# Patient Record
Sex: Male | Born: 1980 | Race: White | Hispanic: No | Marital: Married | Smoking: Current every day smoker
Health system: Southern US, Community
[De-identification: ages and names within clinical notes are randomized; demographics above are authoritative.]

## PROBLEM LIST (undated history)

## (undated) DIAGNOSIS — K219 Gastro-esophageal reflux disease without esophagitis: Secondary | ICD-10-CM

## (undated) DIAGNOSIS — D3A026 Benign carcinoid tumor of the rectum: Secondary | ICD-10-CM

## (undated) DIAGNOSIS — I10 Essential (primary) hypertension: Secondary | ICD-10-CM

## (undated) DIAGNOSIS — E119 Type 2 diabetes mellitus without complications: Secondary | ICD-10-CM

## (undated) DIAGNOSIS — Z973 Presence of spectacles and contact lenses: Secondary | ICD-10-CM

## (undated) DIAGNOSIS — K760 Fatty (change of) liver, not elsewhere classified: Secondary | ICD-10-CM

## (undated) DIAGNOSIS — J45909 Unspecified asthma, uncomplicated: Secondary | ICD-10-CM

## (undated) HISTORY — DX: Essential (primary) hypertension: I10

## (undated) HISTORY — DX: Type 2 diabetes mellitus without complications: E11.9

## (undated) HISTORY — DX: Gastro-esophageal reflux disease without esophagitis: K21.9

## (undated) HISTORY — PX: COLONOSCOPY: SHX174

---

## 2008-08-10 ENCOUNTER — Emergency Department (HOSPITAL_COMMUNITY): Admission: EM | Admit: 2008-08-10 | Discharge: 2008-08-10 | Payer: Self-pay | Admitting: Emergency Medicine

## 2015-03-01 HISTORY — PX: ESOPHAGOGASTRODUODENOSCOPY: SHX1529

## 2018-05-27 ENCOUNTER — Encounter: Payer: Self-pay | Admitting: Gastroenterology

## 2018-08-26 ENCOUNTER — Encounter: Payer: Self-pay | Admitting: *Deleted

## 2018-08-26 ENCOUNTER — Encounter: Payer: Self-pay | Admitting: Gastroenterology

## 2018-08-26 ENCOUNTER — Ambulatory Visit (INDEPENDENT_AMBULATORY_CARE_PROVIDER_SITE_OTHER): Payer: Managed Care, Other (non HMO) | Admitting: Gastroenterology

## 2018-08-26 DIAGNOSIS — K760 Fatty (change of) liver, not elsewhere classified: Secondary | ICD-10-CM | POA: Insufficient documentation

## 2018-08-26 NOTE — Progress Notes (Signed)
Primary Care Physician:  Nathaniel Butcher, NP Primary Gastroenterologist:  Dr. Oneida Alar   Chief Complaint  Patient presents with  . fatty liver    HPI:   Nathaniel Wilson is a 37 y.o. male presenting today at the request of Nathaniel Butcher, NP, secondary to fatty liver. Outside imaging with ultrasound abdomen complete May 2019 noting hepatosplenomegaly (liver measures up to 20.1 cm, spleen 14.0cm). Hepatic steatosis. April 2019 labs with Alk Phos 82, AST 49, ALT 63, Tbili 0.4, BUN 12, Cr 0.89.   He tells me that hepatitis labs were drawn. He has seen Dr. Earley Brooke in the past. History of abdominal pain.   EGD 3 years ago, colonoscopy. Dr. Earley Brooke. ?Pre-cancerous polyp and stomach polyp. States he may have had carcinoid tumor but now thinks he didn't. Difficult historian.   Omeprazole 20 mg daily helps with GERD.   Noted right-sided discomfort, RUQ/RLQ, starting several months ago. Pain is constant sometimes. Feels light-headed sometimes. Occasional solid food dysphagia. No rectal bleeding. Sometimes worse after eating. Has intermittent headaches. Drinks a 12 pack of beer daily chronically.   Sister passed away from colon cancer age 37.   Past Medical History:  Diagnosis Date  . Diabetes ([place])   . GERD (gastroesophageal reflux disease)   . Hypertension     Past Surgical History:  Procedure Laterality Date  . COLONOSCOPY     Dr. Earley Brooke  . ESOPHAGOGASTRODUODENOSCOPY     Dr. Earley Brooke    Current Outpatient Medications  Medication Sig Dispense Refill  . fluticasone (FLONASE) 50 MCG/ACT nasal spray Place 1 spray into both nostrils daily.    . Fluticasone-Salmeterol (ADVAIR) 250-50 MCG/DOSE AEPB Inhale 1 puff into the lungs daily.    Marland Kitchen glipiZIDE (GLUCOTROL) 10 MG tablet Take 10 mg by mouth daily before breakfast.    . insulin detemir (LEVEMIR) 100 UNIT/ML injection Inject 12 Units into the skin daily.    Marland Kitchen lisinopril (PRINIVIL,ZESTRIL) 10 MG tablet Take 1 tablet by mouth daily.  2  .  omeprazole (PRILOSEC) 20 MG capsule Take 20 mg by mouth every other day.     No current facility-administered medications for this visit.     Allergies as of 08/26/2018  . (No Known Allergies)    Family History  Problem Relation Age of Onset  . Colon cancer Sister 27       deceased from colon cancer age 83   . Cancer Mother        late 43s, deceased   . Drug abuse Brother        drug overdose early 72s     Social History   Socioeconomic History  . Marital status: Single    Spouse name: Not on file  . Number of children: Not on file  . Years of education: Not on file  . Highest education level: Not on file  Occupational History  . Not on file  Social Needs  . Financial resource strain: Not on file  . Food insecurity:    Worry: Not on file    Inability: Not on file  . Transportation needs:    Medical: Not on file    Non-medical: Not on file  Tobacco Use  . Smoking status: Current Every Day Smoker    Packs/day: 1.00    Types: Cigarettes    Start date: 08/26/1993  . Smokeless tobacco: Never Used  Substance and Sexual Activity  . Alcohol use: Yes    Comment: 12 pack beer per day,  was drinking malt liquor before this daily.   . Drug use: Never  . Sexual activity: Not on file  Lifestyle  . Physical activity:    Days per week: Not on file    Minutes per session: Not on file  . Stress: Not on file  Relationships  . Social connections:    Talks on phone: Not on file    Gets together: Not on file    Attends religious service: Not on file    Active member of club or organization: Not on file    Attends meetings of clubs or organizations: Not on file    Relationship status: Not on file  . Intimate partner violence:    Fear of current or ex partner: Not on file    Emotionally abused: Not on file    Physically abused: Not on file    Forced sexual activity: Not on file  Other Topics Concern  . Not on file  Social History Narrative  . Not on file    Review of  Systems: Gen: Denies any fever, chills, fatigue, weight loss, lack of appetite.  CV: Denies chest pain, heart palpitations, peripheral edema, syncope.  Resp: Denies shortness of breath at rest or with exertion. Denies wheezing or cough.  GI: see HPI  GU : Denies urinary burning, urinary frequency, urinary hesitancy MS: Denies joint pain, muscle weakness, cramps, or limitation of movement.  Derm: Denies rash, itching, dry skin Psych: Denies depression, anxiety, memory loss, and confusion Heme: Denies bruising, bleeding, and enlarged lymph nodes.  Physical Exam: BP 132/74   Pulse 62   Temp (!) 97 F (36.1 C) (Oral)   Ht _0  (1.803 m)   Wt 223 lb 3.2 oz (101.2 kg)   BMI 31.13 kg/m  General:   Alert and oriented. Pleasant and cooperative. Well-nourished and well-developed.  Head:  Normocephalic and atraumatic. Eyes:  Without icterus, sclera clear and conjunctiva pink.  Ears:  Normal auditory acuity. Nose:  No deformity, discharge,  or lesions. Mouth:  No deformity or lesions, oral mucosa pink.  Lungs:  Clear to auscultation bilaterally. No wheezes, rales, or rhonchi. No distress.  Heart:  S1, S2 present without murmurs appreciated.  Abdomen:  +BS, soft, mild TTP RUQ and non-distended. Liver margin palpable below right ribcage.  No guarding or rebound. No masses appreciated.  Rectal:  Deferred  Msk:  Symmetrical without gross deformities. Normal posture. Extremities:  Without edema. Neurologic:  Alert and  oriented x4 Psych:  Alert and cooperative. Normal mood and affect.

## 2018-08-26 NOTE — Patient Instructions (Signed)
I am requesting records from Dr. Earley Brooke.  Please have blood work done.   We have ordered a special ultrasound of your liver.  It is very important to cut down on drinking with goal of eliminating this in the future. Let us know if you need help with referrals for counseling.  I will see you in 3 months regardless!  It was a pleasure to see you today. I strive to create trusting relationships with patients to provide genuine, compassionate, and quality care. I value your feedback. If you receive a survey regarding your visit,  I greatly appreciate you taking time to fill this out.   Annitta Needs, PhD, ANP-BC [facility]

## 2018-08-27 ENCOUNTER — Encounter: Payer: Self-pay | Admitting: Gastroenterology

## 2018-08-27 NOTE — Assessment & Plan Note (Signed)
37 year old male with known fatty liver, hepatosplenomegaly on ultrasound earlier this year, mildly elevated transaminases in the setting of chronic ETOH use. RUQ pain for several months in setting of hepatomegaly. Does not appear biliary related.   Discussed at length progression to likely cirrhosis if continued ETOH use. Discussed referral to treatment center but declining. Will check CBC, HFP, BMP, INR, iron studies, and elastography now. May need updated EGD if evidence of advanced fibrosis.  Obtain outside viral serologies reportedly already completed Continue omeprazole once daily Attempt to obtain outside EGD/TCS reports from Dr. Earley Brooke Healthy diet/exercise discussed Return in 3 months regardless

## 2018-08-28 NOTE — Progress Notes (Signed)
CC'D TO PCP °

## 2018-09-09 ENCOUNTER — Ambulatory Visit (HOSPITAL_COMMUNITY)
Admission: RE | Admit: 2018-09-09 | Discharge: 2018-09-09 | Disposition: A | Discharge status: Home / Self Care | Payer: Managed Care, Other (non HMO) | Source: Ambulatory Visit | Attending: Gastroenterology | Admitting: Gastroenterology

## 2018-09-09 DIAGNOSIS — K760 Fatty (change of) liver, not elsewhere classified: Secondary | ICD-10-CM | POA: Diagnosis present

## 2018-09-12 NOTE — Progress Notes (Signed)
Elastography with F2/F3. Alcohol cessation strongly advised. Awaiting labs. Has he had these done?

## 2018-09-15 NOTE — Progress Notes (Signed)
PT's wife, Nathaniel Wilson called for the results and is aware. He had labs done with PCP.  She will call PCP and have the lab results faxed here

## 2018-09-15 NOTE — Progress Notes (Signed)
LMOM to call.

## 2018-09-16 NOTE — Progress Notes (Signed)
I received labs. I still don't have hepatitis labs. 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 He also has Barrett's esophagus. He needs colonoscopy/EGD with Propofol by Dr. Oneida Alar if he is willing. I need to discuss with them risks and benefits when you get up with them.

## 2018-09-16 NOTE — Progress Notes (Signed)
Outside labs reviewed Sept 2019:  CBC without anemia, Platelets 181, BUN 15, Creatinine 0.85, Alk Phos 73, AST 38, ALT 50, iron sats 37, ferritin 289, INR 1.1.   I still don't have records of viral hepatitis labs.   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   EGD March 2016: esophageal erosions, erosions in duodenal bulb, +Barrett's without dysplasia.   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 He is also overdue for routine Barrett's surveillance.   All records were obtained after his visit and not present with him at time of visit. We will contact him and discuss pursuing colonoscopy/EGD with Dr. Oneida Alar with Propofol in near future.

## 2018-09-22 NOTE — Progress Notes (Signed)
LMOM to call.

## 2018-09-23 NOTE — Progress Notes (Signed)
LMOM to call.

## 2018-09-24 ENCOUNTER — Telehealth: Payer: Self-pay | Admitting: Gastroenterology

## 2018-09-24 NOTE — Telephone Encounter (Signed)
Pt called for DS. Please call [phone]

## 2018-09-24 NOTE — Progress Notes (Signed)
Letter mailed to call.  

## 2018-09-24 NOTE — Telephone Encounter (Signed)
Pt's wife, Julien Girt called and was told the info ( see US Abdomen results documentation).  She said to proceed to schedule the TCS and EGD, but Vicente Males wants to speak with him about the risks and benefits. She said she would have pt call this afternoon when he gets off work at 4:30 pm.

## 2018-09-29 ENCOUNTER — Telehealth: Payer: Self-pay

## 2018-09-29 ENCOUNTER — Other Ambulatory Visit: Payer: Self-pay

## 2018-09-29 DIAGNOSIS — K22719 Barrett's esophagus with dysplasia, unspecified: Secondary | ICD-10-CM

## 2018-09-29 DIAGNOSIS — Z85040 Personal history of malignant carcinoid tumor of rectum: Secondary | ICD-10-CM

## 2018-09-29 MED ORDER — PEG 3350-KCL-NA BICARB-NACL 420 G PO SOLR: 4000.0000 mL | ORAL | 0 refills | Status: DC

## 2018-09-29 NOTE — Progress Notes (Signed)
Forwarding to [facility] to schedule.  

## 2018-09-29 NOTE — Progress Notes (Signed)
No oral diabetes meds day of. 1/2 dose of Levemir day prior.

## 2018-09-29 NOTE — Progress Notes (Signed)
Late entry: I did speak with patient regarding colonoscopy/EGD with Propofol by Dr. Oneida Alar. He is aware of risks and benefits and agreeable to proceed.

## 2018-09-29 NOTE — Telephone Encounter (Signed)
See result note.  

## 2018-09-29 NOTE — Telephone Encounter (Signed)
Opened in error

## 2018-09-30 ENCOUNTER — Telehealth: Payer: Self-pay

## 2018-09-30 ENCOUNTER — Telehealth: Payer: Self-pay | Admitting: Gastroenterology

## 2018-09-30 NOTE — Telephone Encounter (Signed)
Pt called to let us know his doctor called him about her [place] results and was told to follow up for his fatty liver. He is aware of his OV in January.

## 2018-09-30 NOTE — Telephone Encounter (Signed)
Pt returned Martina's call and is aware that she mailed all of his information and instructions and the procedure is scheduled for Dec.   He knows to check with his pharmacy also in regards to his prescription.

## 2018-10-14 ENCOUNTER — Telehealth: Payer: Self-pay | Admitting: Gastroenterology

## 2018-10-14 NOTE — Telephone Encounter (Signed)
Lmom, waiting on a return call.  

## 2018-10-14 NOTE — Telephone Encounter (Signed)
[phone]  PLEASE CALL PATIENT, HE HAS QUESTION ABOUT FATTY LIVER

## 2018-10-14 NOTE — Telephone Encounter (Signed)
Pt called back and said the results given to him s/p his u/s are different from what his pcp or other doctor told him. He wants to discuss the results again with the nurse that he previously spoke with. Will route message to DS.

## 2018-10-15 NOTE — Telephone Encounter (Signed)
PT has questions about his [place]. Said he was told by PCP that he has fatty liver.  He wants to know if he has cirrhosis of the liver. I told him only see fatty filtration of the liver and risk of fibrosis is moderate. Vicente Males, please advise.

## 2018-10-15 NOTE — Telephone Encounter (Signed)
Yes, he has a fatty liver. No cirrhosis. However, it can progress to cirrhosis over time. He needs to completely avoid alcohol. 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 The range is 0-4; so, he is at risk of progressing to 4, which would be concerning for cirrhosis.   Instructions for fatty liver: Recommend 1-2# weight loss per week until ideal body weight through exercise & diet. Low fat/cholesterol diet.   Avoid sweets, sodas, fruit juices, sweetened beverages like tea, etc. Gradually increase exercise from 15 min daily up to 1 hr per day 5 days/week. Limit alcohol use.

## 2018-10-15 NOTE — Telephone Encounter (Signed)
LMOM for a return call.  

## 2018-10-16 NOTE — Telephone Encounter (Signed)
PT is aware and I have mailed him the instructions for fatty liver and the diets.

## 2018-10-16 NOTE — Telephone Encounter (Signed)
LMOM to call.

## 2018-10-20 ENCOUNTER — Other Ambulatory Visit: Payer: Self-pay

## 2018-10-20 ENCOUNTER — Telehealth: Payer: Self-pay

## 2018-10-20 DIAGNOSIS — R109 Unspecified abdominal pain: Secondary | ICD-10-CM

## 2018-10-20 DIAGNOSIS — K760 Fatty (change of) liver, not elsewhere classified: Secondary | ICD-10-CM

## 2018-10-20 NOTE — Telephone Encounter (Signed)
VM received from pt asking nurse to call back. Returned call. Pt is concerned with the sharp pain he has in his upper rt abdomen, mid abdomen and lower rt abdomen. Pt states his stool is grey x 2 weeks and scared it's his gall bladder. He has a bowel movement daily and reports it's been once daily since he stopped drinking. Pt says he d/c drinking last Thursday and use to have 3 bowel movements daily when he drunk. Pt would like to be seen before 12/2018. He is scheduled for a TCS in 11/2018 with SF.

## 2018-10-20 NOTE — Telephone Encounter (Signed)
Noted  

## 2018-10-20 NOTE — Telephone Encounter (Signed)
No gallstones on recent ultrasound. We need to check CMP now. It is not uncommon to have more frequent stools when drinking alcohol; now that he has stopped drinking, he is at a new baseline. Please arrange an appt to be seen in next 2-3 weeks.

## 2018-10-20 NOTE — Telephone Encounter (Signed)
Pt is aware. Lab orders placed for [facility] per pts request. Please schedule apt in 2-3 weeks with AB.

## 2018-10-20 NOTE — Addendum Note (Signed)
Addended by: Annitta Needs on: 10/20/2018 04:27 PM   Modules accepted: Orders

## 2018-10-20 NOTE — Telephone Encounter (Signed)
Alicia: I inadvertently signed lab order before I realized what it was. Needed to be complete metabolic panel. I have put back in under [facility].

## 2018-10-21 ENCOUNTER — Encounter: Payer: Self-pay | Admitting: Gastroenterology

## 2018-10-21 NOTE — Telephone Encounter (Signed)
PATIENT SCHEDULED FOR Friday 10/31/18.  LEFT MESSAGE ON PATIENT CELL AND ALSO MAILED LETTER

## 2018-10-31 ENCOUNTER — Ambulatory Visit: Payer: BLUE CROSS/BLUE SHIELD | Admitting: Gastroenterology

## 2018-10-31 ENCOUNTER — Encounter: Payer: Self-pay | Admitting: Gastroenterology

## 2018-10-31 VITALS — BP 113/74 | HR 66 | Temp 97.2°F | Ht 71.0 in | Wt 219.6 lb

## 2018-10-31 DIAGNOSIS — K2270 Barrett's esophagus without dysplasia: Secondary | ICD-10-CM | POA: Diagnosis not present

## 2018-10-31 DIAGNOSIS — R1011 Right upper quadrant pain: Secondary | ICD-10-CM | POA: Diagnosis not present

## 2018-10-31 DIAGNOSIS — D3A026 Benign carcinoid tumor of the rectum: Secondary | ICD-10-CM | POA: Diagnosis not present

## 2018-10-31 NOTE — Progress Notes (Addendum)
REVIEWED-NO ADDITIONAL RECOMMENDATIONS.  Referring Provider: Allayne Butcher, NP Primary Care Physician:  Allayne Butcher, NP Primary GI: Dr. Oneida Alar   Chief Complaint  Patient presents with  . Abdominal Pain    upper/lower abd, right side. Upper right pain is sharp/stabby at times  . Diarrhea    QOD    HPI:   Nathaniel Wilson is a 37 y.o. 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 History of Barrett's with last EGD in March 2016. He is already scheduled for colonoscopy/EGD with Dr. Oneida Alar in December with Propofol. He was brought in due to reports of RUQ abdominal pain that has persisted since visit in Aug 2019 and reports of gray stool.  History of fatty liver as well, ETOH abuse, with elastography F2/F3.   When quit drinking for 4 days, started to see gray stool for a few days. Has epigastric pain, RUQ, RLQ. Chronic. Notes postprandial pain at times. When walking, pain is worse. No N/V. Prilosec 20 mg daily, which helps indigestion. No dysphagia. 3 watery stools with chunks every day. Working on trying to quit. Went back to drinking and trying to decrease the amount of alcohol per day.    Outside labs reviewed Sept 2019:  CBC without anemia, Platelets 181, BUN 15, Creatinine 0.85, Alk Phos 73, AST 38, ALT 50, iron sats 37, ferritin 289, INR 1.1.   Nov 2018: negative Hep A antibody, negative Hep B surface antigen, negative Hep B core antibody, negative Hep C antibody.    Past Medical History:  Diagnosis Date  . Diabetes ([place])   . GERD (gastroesophageal reflux disease)   . Hypertension     Past Surgical History:  Procedure Laterality Date  . COLONOSCOPY     Dr. 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 ESOPHAGOGASTRODUODENOSCOPY  03/2015   Dr. Earley Brooke: esophageal erosions, erosions in duodenal bulb, +Barrett's but no dysplasia     Current Outpatient Medications  Medication Sig Dispense Refill  . fluticasone (FLONASE) 50 MCG/ACT nasal spray Place 1 spray into both nostrils daily.    . Fluticasone-Salmeterol (ADVAIR) 250-50 MCG/DOSE AEPB Inhale 1 puff into the lungs daily.    Marland Kitchen glipiZIDE (GLUCOTROL) 10 MG tablet Take 10 mg by mouth daily before breakfast.    . insulin detemir (LEVEMIR) 100 UNIT/ML injection Inject 12 Units into the skin daily.    Marland Kitchen lisinopril (PRINIVIL,ZESTRIL) 10 MG tablet Take 1 tablet by mouth daily.  2  . omeprazole (PRILOSEC) 20 MG capsule Take 20 mg by mouth every other day.    . polyethylene glycol-electrolytes (TRILYTE) 420 g solution Take 4,000 mLs by mouth as directed. 4000 mL 0   No current facility-administered medications for this visit.     Allergies as of 10/31/2018  . (No Known Allergies)    Family History  Problem Relation Age of Onset  . Colon cancer Sister 102       deceased from colon cancer age 21   . Cancer Mother        late 11s, deceased   . Drug abuse Brother        drug overdose early 80s     Social History   Socioeconomic History  . Marital status: Married    Spouse name: Not on file  . Number of children: Not on file  . Years of education: Not on file  . Highest education level:  Not on file  Occupational History  . Not on file  Social Needs  . Financial resource strain: Not on file  . Food insecurity:    Worry: Not on file    Inability: Not on file  . Transportation needs:    Medical: Not on file    Non-medical: Not on file  Tobacco Use  . Smoking status: Current Every Day Smoker    Packs/day: 1.00    Types: Cigarettes    Start date: 08/26/1993  . Smokeless tobacco: Never Used  Substance and Sexual Activity  . Alcohol use: Yes    Comment: 12 pack beer per day,  was drinking malt liquor before this daily.   . Drug use: Never  . Sexual activity: Not on file  Lifestyle  . Physical activity:    Days per week: Not on file    Minutes per  session: Not on file  . Stress: Not on file  Relationships  . Social connections:    Talks on phone: Not on file    Gets together: Not on file    Attends religious service: Not on file    Active member of club or organization: Not on file    Attends meetings of clubs or organizations: Not on file    Relationship status: Not on file  Other Topics Concern  . Not on file  Social History Narrative  . Not on file    Review of Systems: Gen: Denies fever, chills, anorexia. Denies fatigue, weakness, weight loss.  CV: Denies chest pain, palpitations, syncope, peripheral edema, and claudication. Resp: Denies dyspnea at rest, cough, wheezing, coughing up blood, and pleurisy. GI: see HPI. Derm: Denies rash, itching, dry skin Psych: Denies depression, anxiety, memory loss, confusion. No homicidal or suicidal ideation.  Heme: Denies bruising, bleeding, and enlarged lymph nodes.  Physical Exam: BP 113/74   Pulse 66   Temp (!) 97.2 F (36.2 C) (Oral)   Ht 5' 11"  (1.803 m)   Wt 219 lb 9.6 oz (99.6 kg)   BMI 30.63 kg/m  General:   Alert and oriented. No distress noted. Pleasant and cooperative.  Head:  Normocephalic and atraumatic. Eyes:  Conjuctiva clear without scleral icterus. Mouth:  Oral mucosa pink and moist.  Lungs: clear to auscultation bilaterally  Cardiac: S1 S2 present without murmurs  Abdomen:  +BS, soft, non-tender and non-distended. No rebound or guarding. No HSM or masses noted. Msk:  Symmetrical without gross deformities. Normal posture. Extremities:  Without edema. Neurologic:  Alert and  oriented x4 Psych:  Alert and cooperative. Normal mood and affect.

## 2018-10-31 NOTE — Assessment & Plan Note (Signed)
History of 1.5 cm neuroendocrine tumor on path in 2016. No colonoscopy surveillance. Family history notable for colon cancer in sister at age 39, who is now deceased.  Proceed with colonoscopy with Dr. Oneida Alar in the near future. The risks, benefits, and alternatives have been discussed in detail with the patient. They state understanding and desire to proceed.  Propofol due to polypharmacy

## 2018-10-31 NOTE — Patient Instructions (Signed)
No PA needed for HIDA per AIM website.

## 2018-10-31 NOTE — Assessment & Plan Note (Signed)
Last EGD in 2016 by Dr. Earley Brooke. Surveillance due now. Continue Prilosec once daily, as this is symptomatically controlling GERD.   Proceed with upper endoscopy in the near future with Dr. Oneida Alar. The risks, benefits, and alternatives have been discussed in detail with patient. They have stated understanding and desire to proceed.  Propofol due to polypharmacy

## 2018-10-31 NOTE — Patient Instructions (Addendum)
I have ordered a special scan of your gallbladder.  If you have any clay-colored, gray stools, call us and we will do lab work.  Continue to work on decreasing alcohol intake, with the goal of no alcohol in the future.  Keep appointment for colonoscopy and upper endoscopy upcoming with Dr. Oneida Alar!  You will take 1/2 dose of insulin the day prior to colonoscopy, no insulin the day of the procedure. No glipizide the day of the procedure.  I enjoyed seeing you again today! As you know, I value our relationship and want to provide genuine, compassionate, and quality care. I welcome your feedback. If you receive a survey regarding your visit,  I greatly appreciate you taking time to fill this out. See you next time!  Annitta Needs, PhD, ANP-BC [facility]

## 2018-10-31 NOTE — Assessment & Plan Note (Signed)
Chronic RUQ, RLQ pain, postprandial component. Last EGD in 2016 with Barrett's and due for surveillance now. Gallbladder remains in situ. No gallstones. No concerning signs on exam. RLQ appears to be worsened with walking. May need CT in future if persistent. Notes gray stool when he abstained from drinking, now resolved. If further stool color changes, repeat HFP. Currently with unremarkable LFTs except mildly elevated ALT at 50 in Sept 2019. Known fatty liver.  Pursue HIDA Monitor for any stool color changes: obtain HFP if any further evidence Keep EGD appt upcoming Continue Prilosec Discussed importance of ETOH cessation

## 2018-11-03 NOTE — Progress Notes (Signed)
cc'ed to pcp °

## 2018-11-06 ENCOUNTER — Other Ambulatory Visit (HOSPITAL_COMMUNITY): Payer: BLUE CROSS/BLUE SHIELD

## 2018-11-19 ENCOUNTER — Ambulatory Visit (HOSPITAL_COMMUNITY): Payer: BLUE CROSS/BLUE SHIELD

## 2018-11-21 NOTE — Patient Instructions (Signed)
Nathaniel Wilson  11/21/2018     @PREFPERIOPPHARMACY @   Your procedure is scheduled on 12/09/2018.  Report to [facility] at  815   A.M.  Call this number if you have problems the morning of surgery:  [phone]   Remember:  Follow the diet and prep instructions given to you by Dr Nona Dell office.                       Take these medicines the morning of surgery with A SIP OF WATER  Lisinopril, omeprazole. Use your inhaler before you come. Take 1/2 of your usual night time insulin dosage the night before your procedure. DO NOT take any medications for diabetes the morning of your procedure.    Do not wear jewelry, make-up or nail polish.  Do not wear lotions, powders, or perfumes, or deodorant.  Do not shave 48 hours prior to surgery.  Men may shave face and neck.  Do not bring valuables to the hospital.  [facility] is not responsible for any belongings or valuables.  Contacts, dentures or bridgework may not be worn into surgery.  Leave your suitcase in the car.  After surgery it may be brought to your room.  For patients admitted to the hospital, discharge time will be determined by your treatment team.  Patients discharged the day of surgery will not be allowed to drive home.   Name and phone number of your driver:   Family   Special instructions:   None  Please read over the following fact sheets that you were given. Anesthesia Post-op Instructions and Care and Recovery After Surgery       Esophagogastroduodenoscopy Esophagogastroduodenoscopy (EGD) is a procedure to examine the lining of the esophagus, stomach, and first part of the small intestine (duodenum). This procedure is done to check for problems such as inflammation, bleeding, ulcers, or growths. During this procedure, a long, flexible, lighted tube with a camera attached (endoscope) is inserted down the throat. Tell a health care provider about:  Any allergies you have.  All  medicines you are taking, including vitamins, herbs, eye drops, creams, and over-the-counter medicines.  Any problems you or family members have had with anesthetic medicines.  Any blood disorders you have.  Any surgeries you have had.  Any medical conditions you have.  Whether you are pregnant or may be pregnant. What are the risks? Generally, this is a safe procedure. However, problems may occur, including:  Infection.  Bleeding.  A tear (perforation) in the esophagus, stomach, or duodenum.  Trouble breathing.  Excessive sweating.  Spasms of the larynx.  A slowed heartbeat.  Low blood pressure.  What happens before the procedure?  Follow instructions from your health care provider about eating or drinking restrictions.  Ask your health care provider about: ? Changing or stopping your regular medicines. This is especially important if you are taking diabetes medicines or blood thinners. ? Taking medicines such as aspirin and ibuprofen. These medicines can thin your blood. Do not take these medicines before your procedure if your health care provider instructs you not to.  Plan to have someone take you home after the procedure.  If you wear dentures, be ready to remove them before the procedure. What happens during the procedure?  To reduce your risk of infection, your health care team will wash or sanitize their hands.  An IV tube will  be put in a vein in your hand or arm. You will get medicines and fluids through this tube.  You will be given one or more of the following: ? A medicine to help you relax (sedative). ? A medicine to numb the area (local anesthetic). This medicine may be sprayed into your throat. It will make you feel more comfortable and keep you from gagging or coughing during the procedure. ? A medicine for pain.  A mouth guard may be placed in your mouth to protect your teeth and to keep you from biting on the endoscope.  You will be asked to  lie on your left side.  The endoscope will be lowered down your throat into your esophagus, stomach, and duodenum.  Air will be put into the endoscope. This will help your health care provider see better.  The lining of your esophagus, stomach, and duodenum will be examined.  Your health care provider may: ? Take a tissue sample so it can be looked at in a lab (biopsy). ? Remove growths. ? Remove objects (foreign bodies) that are stuck. ? Treat any bleeding with medicines or other devices that stop tissue from bleeding. ? Widen (dilate) or stretch narrowed areas of your esophagus and stomach.  The endoscope will be taken out. The procedure may vary among health care providers and hospitals. What happens after the procedure?  Your blood pressure, heart rate, breathing rate, and blood oxygen level will be monitored often until the medicines you were given have worn off.  Do not eat or drink anything until the numbing medicine has worn off and your gag reflex has returned. This information is not intended to replace advice given to you by your health care provider. Make sure you discuss any questions you have with your health care provider. Document Released: 04/19/2005 Document Revised: 05/24/2016 Document Reviewed: 11/10/2015 Elsevier Interactive Patient Education  2018 [employer]. Esophagogastroduodenoscopy, Care After Refer to this sheet in the next few weeks. These instructions provide you with information about caring for yourself after your procedure. Your health care provider may also give you more specific instructions. Your treatment has been planned according to current medical practices, but problems sometimes occur. Call your health care provider if you have any problems or questions after your procedure. What can I expect after the procedure? After the procedure, it is common to have:  A sore throat.  Nausea.  Bloating.  Dizziness.  Fatigue.  Follow these  instructions at home:  Do not eat or drink anything until the numbing medicine (local anesthetic) has worn off and your gag reflex has returned. You will know that the local anesthetic has worn off when you can swallow comfortably.  Do not drive for 24 hours if you received a medicine to help you relax (sedative).  If your health care provider took a tissue sample for testing during the procedure, make sure to get your test results. This is your responsibility. Ask your health care provider or the department performing the test when your results will be ready.  Keep all follow-up visits as told by your health care provider. This is important. Contact a health care provider if:  You cannot stop coughing.  You are not urinating.  You are urinating less than usual. Get help right away if:  You have trouble swallowing.  You cannot eat or drink.  You have throat or chest pain that gets worse.  You are dizzy or light-headed.  You faint.  You have nausea  or vomiting.  You have chills.  You have a fever.  You have severe abdominal pain.  You have black, tarry, or bloody stools. This information is not intended to replace advice given to you by your health care provider. Make sure you discuss any questions you have with your health care provider. Document Released: 12/03/2012 Document Revised: 05/24/2016 Document Reviewed: 11/10/2015 Elsevier Interactive Patient Education  2018 [employer].  Colonoscopy, Adult A colonoscopy is an exam to look at the large intestine. It is done to check for problems, such as:  Lumps (tumors).  Growths (polyps).  Swelling (inflammation).  Bleeding.  What happens before the procedure? Eating and drinking Follow instructions from your doctor about eating and drinking. These instructions may include:  A few days before the procedure - follow a low-fiber diet. ? Avoid nuts. ? Avoid seeds. ? Avoid dried fruit. ? Avoid raw  fruits. ? Avoid vegetables.  1-3 days before the procedure - follow a clear liquid diet. Avoid liquids that have red or purple dye. Drink only clear liquids, such as: ? Clear broth or bouillon. ? Black coffee or tea. ? Clear juice. ? Clear soft drinks or sports drinks. ? Gelatin dessert. ? Popsicles.  On the day of the procedure - do not eat or drink anything during the 2 hours before the procedure.  Bowel prep If you were prescribed an oral bowel prep:  Take it as told by your doctor. Starting the day before your procedure, you will need to drink a lot of liquid. The liquid will cause you to poop (have bowel movements) until your poop is almost clear or light green.  If your skin or butt gets irritated from diarrhea, you may: ? Wipe the area with wipes that have medicine in them, such as adult wet wipes with aloe and vitamin E. ? Put something on your skin that soothes the area, such as petroleum jelly.  If you throw up (vomit) while drinking the bowel prep, take a break for up to 60 minutes. Then begin the bowel prep again. If you keep throwing up and you cannot take the bowel prep without throwing up, call your doctor.  General instructions  Ask your doctor about changing or stopping your normal medicines. This is important if you take diabetes medicines or blood thinners.  Plan to have someone take you home from the hospital or clinic. What happens during the procedure?  An IV tube may be put into one of your veins.  You will be given medicine to help you relax (sedative).  To reduce your risk of infection: ? Your doctors will wash their hands. ? Your anal area will be washed with soap.  You will be asked to lie on your side with your knees bent.  Your doctor will get a long, thin, flexible tube ready. The tube will have a camera and a light on the end.  The tube will be put into your anus.  The tube will be gently put into your large intestine.  Air will be  delivered into your large intestine to keep it open. You may feel some pressure or cramping.  The camera will be used to take photos.  A small tissue sample may be removed from your body to be looked at under a microscope (biopsy). If any possible problems are found, the tissue will be sent to a lab for testing.  If small growths are found, your doctor may remove them and have them checked for cancer.  The tube that was put into your anus will be slowly removed. The procedure may vary among doctors and hospitals. What happens after the procedure?  Your doctor will check on you often until the medicines you were given have worn off.  Do not drive for 24 hours after the procedure.  You may have a small amount of blood in your poop.  You may pass gas.  You may have mild cramps or bloating in your belly (abdomen).  It is up to you to get the results of your procedure. Ask your doctor, or the department performing the procedure, when your results will be ready. This information is not intended to replace advice given to you by your health care provider. Make sure you discuss any questions you have with your health care provider. Document Released: 01/19/2011 Document Revised: 10/17/2016 Document Reviewed: 02/28/2016 Elsevier Interactive Patient Education  2017 [employer]  Colonoscopy, Adult, Care After This sheet gives you information about how to care for yourself after your procedure. Your health care provider may also give you more specific instructions. If you have problems or questions, contact your health care provider. What can I expect after the procedure? After the procedure, it is common to have:  A small amount of blood in your stool for 24 hours after the procedure.  Some gas.  Mild abdominal cramping or bloating.  Follow these instructions at home: General instructions   For the first 24 hours after the procedure: ? Do not drive or use machinery. ? Do not sign  important documents. ? Do not drink alcohol. ? Do your regular daily activities at a slower pace than normal. ? Eat soft, easy-to-digest foods. ? Rest often.  Take over-the-counter or prescription medicines only as told by your health care provider.  It is up to you to get the results of your procedure. Ask your health care provider, or the department performing the procedure, when your results will be ready. Relieving cramping and bloating  Try walking around when you have cramps or feel bloated.  Apply heat to your abdomen as told by your health care provider. Use a heat source that your health care provider recommends, such as a moist heat pack or a heating pad. ? Place a towel between your skin and the heat source. ? Leave the heat on for 20-30 minutes. ? Remove the heat if your skin turns bright red. This is especially important if you are unable to feel pain, heat, or cold. You may have a greater risk of getting burned. Eating and drinking  Drink enough fluid to keep your urine clear or pale yellow.  Resume your normal diet as instructed by your health care provider. Avoid heavy or fried foods that are hard to digest.  Avoid drinking alcohol for as long as instructed by your health care provider. Contact a health care provider if:  You have blood in your stool 2-3 days after the procedure. Get help right away if:  You have more than a small spotting of blood in your stool.  You pass large blood clots in your stool.  Your abdomen is swollen.  You have nausea or vomiting.  You have a fever.  You have increasing abdominal pain that is not relieved with medicine. This information is not intended to replace advice given to you by your health care provider. Make sure you discuss any questions you have with your health care provider. Document Released: 07/31/2004 Document Revised: 09/10/2016 Document Reviewed: 02/28/2016 Elsevier Interactive  Patient Education  2018 [place] Anesthesia is a term that refers to techniques, procedures, and medicines that help a person stay safe and comfortable during a medical procedure. Monitored anesthesia care, or sedation, is one type of anesthesia. Your anesthesia specialist may recommend sedation if you will be having a procedure that does not require you to be unconscious, such as:  Cataract surgery.  A dental procedure.  A biopsy.  A colonoscopy.  During the procedure, you may receive a medicine to help you relax (sedative). There are three levels of sedation:  Mild sedation. At this level, you may feel awake and relaxed. You will be able to follow directions.  Moderate sedation. At this level, you will be sleepy. You may not remember the procedure.  Deep sedation. At this level, you will be asleep. You will not remember the procedure.  The more medicine you are given, the deeper your level of sedation will be. Depending on how you respond to the procedure, the anesthesia specialist may change your level of sedation or the type of anesthesia to fit your needs. An anesthesia specialist will monitor you closely during the procedure. Let your health care provider know about:  Any allergies you have.  All medicines you are taking, including vitamins, herbs, eye drops, creams, and over-the-counter medicines.  Any use of steroids (by mouth or as a cream).  Any problems you or family members have had with sedatives and anesthetic medicines.  Any blood disorders you have.  Any surgeries you have had.  Any medical conditions you have, such as sleep apnea.  Whether you are pregnant or may be pregnant.  Any use of cigarettes, alcohol, or street drugs. What are the risks? Generally, this is a safe procedure. However, problems may occur, including:  Getting too much medicine (oversedation).  Nausea.  Allergic reaction to medicines.  Trouble breathing. If this happens, a breathing  tube may be used to help with breathing. It will be removed when you are awake and breathing on your own.  Heart trouble.  Lung trouble.  Before the procedure Staying hydrated Follow instructions from your health care provider about hydration, which may include:  Up to 2 hours before the procedure - you may continue to drink clear liquids, such as water, clear fruit juice, black coffee, and plain tea.  Eating and drinking restrictions Follow instructions from your health care provider about eating and drinking, which may include:  8 hours before the procedure - stop eating heavy meals or foods such as meat, fried foods, or fatty foods.  6 hours before the procedure - stop eating light meals or foods, such as toast or cereal.  6 hours before the procedure - stop drinking milk or drinks that contain milk.  2 hours before the procedure - stop drinking clear liquids.  Medicines Ask your health care provider about:  Changing or stopping your regular medicines. This is especially important if you are taking diabetes medicines or blood thinners.  Taking medicines such as aspirin and ibuprofen. These medicines can thin your blood. Do not take these medicines before your procedure if your health care provider instructs you not to.  Tests and exams  You will have a physical exam.  You may have blood tests done to show: ? How well your kidneys and liver are working. ? How well your blood can clot.  General instructions  Plan to have someone take you home from the hospital or clinic.  If  you will be going home right after the procedure, plan to have someone with you for 24 hours.  What happens during the procedure?  Your blood pressure, heart rate, breathing, level of pain and overall condition will be monitored.  An IV tube will be inserted into one of your veins.  Your anesthesia specialist will give you medicines as needed to keep you comfortable during the procedure. This  may mean changing the level of sedation.  The procedure will be performed. After the procedure  Your blood pressure, heart rate, breathing rate, and blood oxygen level will be monitored until the medicines you were given have worn off.  Do not drive for 24 hours if you received a sedative.  You may: ? Feel sleepy, clumsy, or nauseous. ? Feel forgetful about what happened after the procedure. ? Have a sore throat if you had a breathing tube during the procedure. ? Vomit. This information is not intended to replace advice given to you by your health care provider. Make sure you discuss any questions you have with your health care provider. Document Released: 09/12/2005 Document Revised: 05/25/2016 Document Reviewed: 04/08/2016 Elsevier Interactive Patient Education  2018 [place], Care After These instructions provide you with information about caring for yourself after your procedure. Your health care provider may also give you more specific instructions. Your treatment has been planned according to current medical practices, but problems sometimes occur. Call your health care provider if you have any problems or questions after your procedure. What can I expect after the procedure? After your procedure, it is common to:  Feel sleepy for several hours.  Feel clumsy and have poor balance for several hours.  Feel forgetful about what happened after the procedure.  Have poor judgment for several hours.  Feel nauseous or vomit.  Have a sore throat if you had a breathing tube during the procedure.  Follow these instructions at home: For at least 24 hours after the procedure:   Do not: ? Participate in activities in which you could fall or become injured. ? Drive. ? Use heavy machinery. ? Drink alcohol. ? Take sleeping pills or medicines that cause drowsiness. ? Make important decisions or sign legal documents. ? Take care of children on your  own.  Rest. Eating and drinking  Follow the diet that is recommended by your health care provider.  If you vomit, drink water, juice, or soup when you can drink without vomiting.  Make sure you have little or no nausea before eating solid foods. General instructions  Have a responsible adult stay with you until you are awake and alert.  Take over-the-counter and prescription medicines only as told by your health care provider.  If you smoke, do not smoke without supervision.  Keep all follow-up visits as told by your health care provider. This is important. Contact a health care provider if:  You keep feeling nauseous or you keep vomiting.  You feel light-headed.  You develop a rash.  You have a fever. Get help right away if:  You have trouble breathing. This information is not intended to replace advice given to you by your health care provider. Make sure you discuss any questions you have with your health care provider. Document Released: 04/08/2016 Document Revised: 08/08/2016 Document Reviewed: 04/08/2016 Elsevier Interactive Patient Education  [employer].

## 2018-11-25 NOTE — Patient Instructions (Signed)
Nathaniel Wilson  11/25/2018     @PREFPERIOPPHARMACY @   Your procedure is scheduled on  12/09/2018 .  Report to [facility] at  815  A.M.  Call this number if you have problems the morning of surgery:  [phone]   Remember:  Follow the diet and prep instructions given to you by Dr Nona Dell office.                   Take these medicines the morning of surgery with A SIP OF WATER  Lisinopril, prilosec. Use your advair before you come. Take 1/2 of your night time dosage of insulin the night before your procedure.    Do not wear jewelry, make-up or nail polish.  Do not wear lotions, powders, or perfumes, or deodorant.  Do not shave 48 hours prior to surgery.  Men may shave face and neck.  Do not bring valuables to the hospital.  [facility] is not responsible for any belongings or valuables.  Contacts, dentures or bridgework may not be worn into surgery.  Leave your suitcase in the car.  After surgery it may be brought to your room.  For patients admitted to the hospital, discharge time will be determined by your treatment team.  Patients discharged the day of surgery will not be allowed to drive home.   Name and phone number of your driver:   family Special instructions:  DO NOT take any medications for diabetes the morning of your procedure.  Please read over the following fact sheets that you were given. Anesthesia Post-op Instructions and Care and Recovery After Surgery       Esophagogastroduodenoscopy Esophagogastroduodenoscopy (EGD) is a procedure to examine the lining of the esophagus, stomach, and first part of the small intestine (duodenum). This procedure is done to check for problems such as inflammation, bleeding, ulcers, or growths. During this procedure, a long, flexible, lighted tube with a camera attached (endoscope) is inserted down the throat. Tell a health care provider about:  Any allergies you have.  All medicines you are  taking, including vitamins, herbs, eye drops, creams, and over-the-counter medicines.  Any problems you or family members have had with anesthetic medicines.  Any blood disorders you have.  Any surgeries you have had.  Any medical conditions you have.  Whether you are pregnant or may be pregnant. What are the risks? Generally, this is a safe procedure. However, problems may occur, including:  Infection.  Bleeding.  A tear (perforation) in the esophagus, stomach, or duodenum.  Trouble breathing.  Excessive sweating.  Spasms of the larynx.  A slowed heartbeat.  Low blood pressure.  What happens before the procedure?  Follow instructions from your health care provider about eating or drinking restrictions.  Ask your health care provider about: ? Changing or stopping your regular medicines. This is especially important if you are taking diabetes medicines or blood thinners. ? Taking medicines such as aspirin and ibuprofen. These medicines can thin your blood. Do not take these medicines before your procedure if your health care provider instructs you not to.  Plan to have someone take you home after the procedure.  If you wear dentures, be ready to remove them before the procedure. What happens during the procedure?  To reduce your risk of infection, your health care team will wash or sanitize their hands.  An IV tube will be put in a vein in your  hand or arm. You will get medicines and fluids through this tube.  You will be given one or more of the following: ? A medicine to help you relax (sedative). ? A medicine to numb the area (local anesthetic). This medicine may be sprayed into your throat. It will make you feel more comfortable and keep you from gagging or coughing during the procedure. ? A medicine for pain.  A mouth guard may be placed in your mouth to protect your teeth and to keep you from biting on the endoscope.  You will be asked to lie on your left  side.  The endoscope will be lowered down your throat into your esophagus, stomach, and duodenum.  Air will be put into the endoscope. This will help your health care provider see better.  The lining of your esophagus, stomach, and duodenum will be examined.  Your health care provider may: ? Take a tissue sample so it can be looked at in a lab (biopsy). ? Remove growths. ? Remove objects (foreign bodies) that are stuck. ? Treat any bleeding with medicines or other devices that stop tissue from bleeding. ? Widen (dilate) or stretch narrowed areas of your esophagus and stomach.  The endoscope will be taken out. The procedure may vary among health care providers and hospitals. What happens after the procedure?  Your blood pressure, heart rate, breathing rate, and blood oxygen level will be monitored often until the medicines you were given have worn off.  Do not eat or drink anything until the numbing medicine has worn off and your gag reflex has returned. This information is not intended to replace advice given to you by your health care provider. Make sure you discuss any questions you have with your health care provider. Document Released: 04/19/2005 Document Revised: 05/24/2016 Document Reviewed: 11/10/2015 Elsevier Interactive Patient Education  2018 [employer]. Esophagogastroduodenoscopy, Care After Refer to this sheet in the next few weeks. These instructions provide you with information about caring for yourself after your procedure. Your health care provider may also give you more specific instructions. Your treatment has been planned according to current medical practices, but problems sometimes occur. Call your health care provider if you have any problems or questions after your procedure. What can I expect after the procedure? After the procedure, it is common to have:  A sore throat.  Nausea.  Bloating.  Dizziness.  Fatigue.  Follow these instructions at  home:  Do not eat or drink anything until the numbing medicine (local anesthetic) has worn off and your gag reflex has returned. You will know that the local anesthetic has worn off when you can swallow comfortably.  Do not drive for 24 hours if you received a medicine to help you relax (sedative).  If your health care provider took a tissue sample for testing during the procedure, make sure to get your test results. This is your responsibility. Ask your health care provider or the department performing the test when your results will be ready.  Keep all follow-up visits as told by your health care provider. This is important. Contact a health care provider if:  You cannot stop coughing.  You are not urinating.  You are urinating less than usual. Get help right away if:  You have trouble swallowing.  You cannot eat or drink.  You have throat or chest pain that gets worse.  You are dizzy or light-headed.  You faint.  You have nausea or vomiting.  You have chills.  You have a fever.  You have severe abdominal pain.  You have black, tarry, or bloody stools. This information is not intended to replace advice given to you by your health care provider. Make sure you discuss any questions you have with your health care provider. Document Released: 12/03/2012 Document Revised: 05/24/2016 Document Reviewed: 11/10/2015 Elsevier Interactive Patient Education  2018 [employer].  Colonoscopy, Adult A colonoscopy is an exam to look at the large intestine. It is done to check for problems, such as:  Lumps (tumors).  Growths (polyps).  Swelling (inflammation).  Bleeding.  What happens before the procedure? Eating and drinking Follow instructions from your doctor about eating and drinking. These instructions may include:  A few days before the procedure - follow a low-fiber diet. ? Avoid nuts. ? Avoid seeds. ? Avoid dried fruit. ? Avoid raw fruits. ? Avoid  vegetables.  1-3 days before the procedure - follow a clear liquid diet. Avoid liquids that have red or purple dye. Drink only clear liquids, such as: ? Clear broth or bouillon. ? Black coffee or tea. ? Clear juice. ? Clear soft drinks or sports drinks. ? Gelatin dessert. ? Popsicles.  On the day of the procedure - do not eat or drink anything during the 2 hours before the procedure.  Bowel prep If you were prescribed an oral bowel prep:  Take it as told by your doctor. Starting the day before your procedure, you will need to drink a lot of liquid. The liquid will cause you to poop (have bowel movements) until your poop is almost clear or light green.  If your skin or butt gets irritated from diarrhea, you may: ? Wipe the area with wipes that have medicine in them, such as adult wet wipes with aloe and vitamin E. ? Put something on your skin that soothes the area, such as petroleum jelly.  If you throw up (vomit) while drinking the bowel prep, take a break for up to 60 minutes. Then begin the bowel prep again. If you keep throwing up and you cannot take the bowel prep without throwing up, call your doctor.  General instructions  Ask your doctor about changing or stopping your normal medicines. This is important if you take diabetes medicines or blood thinners.  Plan to have someone take you home from the hospital or clinic. What happens during the procedure?  An IV tube may be put into one of your veins.  You will be given medicine to help you relax (sedative).  To reduce your risk of infection: ? Your doctors will wash their hands. ? Your anal area will be washed with soap.  You will be asked to lie on your side with your knees bent.  Your doctor will get a long, thin, flexible tube ready. The tube will have a camera and a light on the end.  The tube will be put into your anus.  The tube will be gently put into your large intestine.  Air will be delivered into your  large intestine to keep it open. You may feel some pressure or cramping.  The camera will be used to take photos.  A small tissue sample may be removed from your body to be looked at under a microscope (biopsy). If any possible problems are found, the tissue will be sent to a lab for testing.  If small growths are found, your doctor may remove them and have them checked for cancer.  The tube that was put into  your anus will be slowly removed. The procedure may vary among doctors and hospitals. What happens after the procedure?  Your doctor will check on you often until the medicines you were given have worn off.  Do not drive for 24 hours after the procedure.  You may have a small amount of blood in your poop.  You may pass gas.  You may have mild cramps or bloating in your belly (abdomen).  It is up to you to get the results of your procedure. Ask your doctor, or the department performing the procedure, when your results will be ready. This information is not intended to replace advice given to you by your health care provider. Make sure you discuss any questions you have with your health care provider. Document Released: 01/19/2011 Document Revised: 10/17/2016 Document Reviewed: 02/28/2016 Elsevier Interactive Patient Education  2017 [employer]  Colonoscopy, Adult, Care After This sheet gives you information about how to care for yourself after your procedure. Your health care provider may also give you more specific instructions. If you have problems or questions, contact your health care provider. What can I expect after the procedure? After the procedure, it is common to have:  A small amount of blood in your stool for 24 hours after the procedure.  Some gas.  Mild abdominal cramping or bloating.  Follow these instructions at home: General instructions   For the first 24 hours after the procedure: ? Do not drive or use machinery. ? Do not sign important  documents. ? Do not drink alcohol. ? Do your regular daily activities at a slower pace than normal. ? Eat soft, easy-to-digest foods. ? Rest often.  Take over-the-counter or prescription medicines only as told by your health care provider.  It is up to you to get the results of your procedure. Ask your health care provider, or the department performing the procedure, when your results will be ready. Relieving cramping and bloating  Try walking around when you have cramps or feel bloated.  Apply heat to your abdomen as told by your health care provider. Use a heat source that your health care provider recommends, such as a moist heat pack or a heating pad. ? Place a towel between your skin and the heat source. ? Leave the heat on for 20-30 minutes. ? Remove the heat if your skin turns bright red. This is especially important if you are unable to feel pain, heat, or cold. You may have a greater risk of getting burned. Eating and drinking  Drink enough fluid to keep your urine clear or pale yellow.  Resume your normal diet as instructed by your health care provider. Avoid heavy or fried foods that are hard to digest.  Avoid drinking alcohol for as long as instructed by your health care provider. Contact a health care provider if:  You have blood in your stool 2-3 days after the procedure. Get help right away if:  You have more than a small spotting of blood in your stool.  You pass large blood clots in your stool.  Your abdomen is swollen.  You have nausea or vomiting.  You have a fever.  You have increasing abdominal pain that is not relieved with medicine. This information is not intended to replace advice given to you by your health care provider. Make sure you discuss any questions you have with your health care provider. Document Released: 07/31/2004 Document Revised: 09/10/2016 Document Reviewed: 02/28/2016 Elsevier Interactive Patient Education  2018 [employer]  Monitored Anesthesia Care Anesthesia is a term that refers to techniques, procedures, and medicines that help a person stay safe and comfortable during a medical procedure. Monitored anesthesia care, or sedation, is one type of anesthesia. Your anesthesia specialist may recommend sedation if you will be having a procedure that does not require you to be unconscious, such as:  Cataract surgery.  A dental procedure.  A biopsy.  A colonoscopy.  During the procedure, you may receive a medicine to help you relax (sedative). There are three levels of sedation:  Mild sedation. At this level, you may feel awake and relaxed. You will be able to follow directions.  Moderate sedation. At this level, you will be sleepy. You may not remember the procedure.  Deep sedation. At this level, you will be asleep. You will not remember the procedure.  The more medicine you are given, the deeper your level of sedation will be. Depending on how you respond to the procedure, the anesthesia specialist may change your level of sedation or the type of anesthesia to fit your needs. An anesthesia specialist will monitor you closely during the procedure. Let your health care provider know about:  Any allergies you have.  All medicines you are taking, including vitamins, herbs, eye drops, creams, and over-the-counter medicines.  Any use of steroids (by mouth or as a cream).  Any problems you or family members have had with sedatives and anesthetic medicines.  Any blood disorders you have.  Any surgeries you have had.  Any medical conditions you have, such as sleep apnea.  Whether you are pregnant or may be pregnant.  Any use of cigarettes, alcohol, or street drugs. What are the risks? Generally, this is a safe procedure. However, problems may occur, including:  Getting too much medicine (oversedation).  Nausea.  Allergic reaction to medicines.  Trouble breathing. If this happens, a breathing  tube may be used to help with breathing. It will be removed when you are awake and breathing on your own.  Heart trouble.  Lung trouble.  Before the procedure Staying hydrated Follow instructions from your health care provider about hydration, which may include:  Up to 2 hours before the procedure - you may continue to drink clear liquids, such as water, clear fruit juice, black coffee, and plain tea.  Eating and drinking restrictions Follow instructions from your health care provider about eating and drinking, which may include:  8 hours before the procedure - stop eating heavy meals or foods such as meat, fried foods, or fatty foods.  6 hours before the procedure - stop eating light meals or foods, such as toast or cereal.  6 hours before the procedure - stop drinking milk or drinks that contain milk.  2 hours before the procedure - stop drinking clear liquids.  Medicines Ask your health care provider about:  Changing or stopping your regular medicines. This is especially important if you are taking diabetes medicines or blood thinners.  Taking medicines such as aspirin and ibuprofen. These medicines can thin your blood. Do not take these medicines before your procedure if your health care provider instructs you not to.  Tests and exams  You will have a physical exam.  You may have blood tests done to show: ? How well your kidneys and liver are working. ? How well your blood can clot.  General instructions  Plan to have someone take you home from the hospital or clinic.  If you will be going home right  after the procedure, plan to have someone with you for 24 hours.  What happens during the procedure?  Your blood pressure, heart rate, breathing, level of pain and overall condition will be monitored.  An IV tube will be inserted into one of your veins.  Your anesthesia specialist will give you medicines as needed to keep you comfortable during the procedure. This  may mean changing the level of sedation.  The procedure will be performed. After the procedure  Your blood pressure, heart rate, breathing rate, and blood oxygen level will be monitored until the medicines you were given have worn off.  Do not drive for 24 hours if you received a sedative.  You may: ? Feel sleepy, clumsy, or nauseous. ? Feel forgetful about what happened after the procedure. ? Have a sore throat if you had a breathing tube during the procedure. ? Vomit. This information is not intended to replace advice given to you by your health care provider. Make sure you discuss any questions you have with your health care provider. Document Released: 09/12/2005 Document Revised: 05/25/2016 Document Reviewed: 04/08/2016 Elsevier Interactive Patient Education  2018 [employer], Care After These instructions provide you with information about caring for yourself after your procedure. Your health care provider may also give you more specific instructions. Your treatment has been planned according to current medical practices, but problems sometimes occur. Call your health care provider if you have any problems or questions after your procedure. What can I expect after the procedure? After your procedure, it is common to:  Feel sleepy for several hours.  Feel clumsy and have poor balance for several hours.  Feel forgetful about what happened after the procedure.  Have poor judgment for several hours.  Feel nauseous or vomit.  Have a sore throat if you had a breathing tube during the procedure.  Follow these instructions at home: For at least 24 hours after the procedure:   Do not: ? Participate in activities in which you could fall or become injured. ? Drive. ? Use heavy machinery. ? Drink alcohol. ? Take sleeping pills or medicines that cause drowsiness. ? Make important decisions or sign legal documents. ? Take care of children on your  own.  Rest. Eating and drinking  Follow the diet that is recommended by your health care provider.  If you vomit, drink water, juice, or soup when you can drink without vomiting.  Make sure you have little or no nausea before eating solid foods. General instructions  Have a responsible adult stay with you until you are awake and alert.  Take over-the-counter and prescription medicines only as told by your health care provider.  If you smoke, do not smoke without supervision.  Keep all follow-up visits as told by your health care provider. This is important. Contact a health care provider if:  You keep feeling nauseous or you keep vomiting.  You feel light-headed.  You develop a rash.  You have a fever. Get help right away if:  You have trouble breathing. This information is not intended to replace advice given to you by your health care provider. Make sure you discuss any questions you have with your health care provider. Document Released: 04/08/2016 Document Revised: 08/08/2016 Document Reviewed: 04/08/2016 Elsevier Interactive Patient Education  [employer].

## 2018-12-02 ENCOUNTER — Inpatient Hospital Stay (HOSPITAL_COMMUNITY)
Admission: RE | Admit: 2018-12-02 | Discharge: 2018-12-02 | Disposition: A | Discharge status: Home / Self Care | Payer: Managed Care, Other (non HMO) | Source: Ambulatory Visit

## 2018-12-03 ENCOUNTER — Encounter (HOSPITAL_COMMUNITY)
Admission: RE | Admit: 2018-12-03 | Discharge: 2018-12-03 | Disposition: A | Discharge status: Home / Self Care | Payer: BLUE CROSS/BLUE SHIELD | Source: Ambulatory Visit | Attending: Gastroenterology | Admitting: Gastroenterology

## 2018-12-08 ENCOUNTER — Encounter (HOSPITAL_COMMUNITY): Payer: Self-pay

## 2018-12-08 ENCOUNTER — Other Ambulatory Visit: Payer: Self-pay

## 2018-12-08 ENCOUNTER — Encounter (HOSPITAL_COMMUNITY)
Admission: RE | Admit: 2018-12-08 | Discharge: 2018-12-08 | Disposition: A | Discharge status: Home / Self Care | Payer: BLUE CROSS/BLUE SHIELD | Source: Ambulatory Visit | Attending: Gastroenterology | Admitting: Gastroenterology

## 2018-12-08 DIAGNOSIS — I4510 Unspecified right bundle-branch block: Secondary | ICD-10-CM | POA: Diagnosis not present

## 2018-12-08 DIAGNOSIS — D3A026 Benign carcinoid tumor of the rectum: Secondary | ICD-10-CM | POA: Diagnosis not present

## 2018-12-08 DIAGNOSIS — Z01810 Encounter for preprocedural cardiovascular examination: Secondary | ICD-10-CM | POA: Diagnosis present

## 2018-12-09 ENCOUNTER — Encounter (HOSPITAL_COMMUNITY): Payer: Self-pay | Admitting: *Deleted

## 2018-12-09 ENCOUNTER — Ambulatory Visit (HOSPITAL_COMMUNITY): Payer: BLUE CROSS/BLUE SHIELD | Admitting: Anesthesiology

## 2018-12-09 ENCOUNTER — Ambulatory Visit (HOSPITAL_COMMUNITY)
Admission: RE | Admit: 2018-12-09 | Discharge: 2018-12-09 | Disposition: A | Discharge status: Home / Self Care | Payer: BLUE CROSS/BLUE SHIELD | Source: Ambulatory Visit | Attending: Gastroenterology | Admitting: Gastroenterology

## 2018-12-09 ENCOUNTER — Encounter (HOSPITAL_COMMUNITY)
Admission: RE | Disposition: A | Discharge status: Home / Self Care | Payer: Self-pay | Source: Ambulatory Visit | Attending: Gastroenterology

## 2018-12-09 DIAGNOSIS — Z7951 Long term (current) use of inhaled steroids: Secondary | ICD-10-CM | POA: Diagnosis not present

## 2018-12-09 DIAGNOSIS — K219 Gastro-esophageal reflux disease without esophagitis: Secondary | ICD-10-CM | POA: Diagnosis not present

## 2018-12-09 DIAGNOSIS — K2950 Unspecified chronic gastritis without bleeding: Secondary | ICD-10-CM | POA: Insufficient documentation

## 2018-12-09 DIAGNOSIS — C7A026 Malignant carcinoid tumor of the rectum: Secondary | ICD-10-CM

## 2018-12-09 DIAGNOSIS — Q438 Other specified congenital malformations of intestine: Secondary | ICD-10-CM | POA: Insufficient documentation

## 2018-12-09 DIAGNOSIS — F17210 Nicotine dependence, cigarettes, uncomplicated: Secondary | ICD-10-CM | POA: Insufficient documentation

## 2018-12-09 DIAGNOSIS — I10 Essential (primary) hypertension: Secondary | ICD-10-CM | POA: Diagnosis not present

## 2018-12-09 DIAGNOSIS — K2970 Gastritis, unspecified, without bleeding: Secondary | ICD-10-CM

## 2018-12-09 DIAGNOSIS — D123 Benign neoplasm of transverse colon: Secondary | ICD-10-CM | POA: Diagnosis not present

## 2018-12-09 DIAGNOSIS — Z794 Long term (current) use of insulin: Secondary | ICD-10-CM | POA: Diagnosis not present

## 2018-12-09 DIAGNOSIS — R1011 Right upper quadrant pain: Secondary | ICD-10-CM

## 2018-12-09 DIAGNOSIS — K621 Rectal polyp: Secondary | ICD-10-CM | POA: Diagnosis not present

## 2018-12-09 DIAGNOSIS — Z85040 Personal history of malignant carcinoid tumor of rectum: Secondary | ICD-10-CM

## 2018-12-09 DIAGNOSIS — E109 Type 1 diabetes mellitus without complications: Secondary | ICD-10-CM | POA: Diagnosis not present

## 2018-12-09 DIAGNOSIS — Z86010 Personal history of colonic polyps: Secondary | ICD-10-CM | POA: Diagnosis not present

## 2018-12-09 DIAGNOSIS — K648 Other hemorrhoids: Secondary | ICD-10-CM | POA: Insufficient documentation

## 2018-12-09 DIAGNOSIS — Z79899 Other long term (current) drug therapy: Secondary | ICD-10-CM | POA: Diagnosis not present

## 2018-12-09 DIAGNOSIS — K635 Polyp of colon: Secondary | ICD-10-CM | POA: Diagnosis not present

## 2018-12-09 DIAGNOSIS — R1013 Epigastric pain: Secondary | ICD-10-CM | POA: Diagnosis present

## 2018-12-09 DIAGNOSIS — Z800 Family history of malignant neoplasm of digestive organs: Secondary | ICD-10-CM | POA: Insufficient documentation

## 2018-12-09 DIAGNOSIS — K22719 Barrett's esophagus with dysplasia, unspecified: Secondary | ICD-10-CM

## 2018-12-09 HISTORY — PX: POLYPECTOMY: SHX5525

## 2018-12-09 HISTORY — PX: COLONOSCOPY WITH PROPOFOL: SHX5780

## 2018-12-09 HISTORY — PX: BIOPSY: SHX5522

## 2018-12-09 HISTORY — PX: ESOPHAGOGASTRODUODENOSCOPY (EGD) WITH PROPOFOL: SHX5813

## 2018-12-09 LAB — GLUCOSE, CAPILLARY
Glucose-Capillary: 93 mg/dL (ref 70–99)
Glucose-Capillary: 97 mg/dL (ref 70–99)

## 2018-12-09 SURGERY — COLONOSCOPY WITH PROPOFOL
Anesthesia: General

## 2018-12-09 MED ORDER — PROPOFOL 500 MG/50ML IV EMUL
INTRAVENOUS | Status: DC | PRN
  Administered 2018-12-09: 125 ug/kg/min via INTRAVENOUS
  Administered 2018-12-09: 10:00:00 via INTRAVENOUS

## 2018-12-09 MED ORDER — PROMETHAZINE HCL 25 MG/ML IJ SOLN: 6.2500 mg | INTRAMUSCULAR | Status: DC | PRN

## 2018-12-09 MED ORDER — STERILE WATER FOR IRRIGATION IR SOLN
Status: DC | PRN
  Administered 2018-12-09: 100 mL

## 2018-12-09 MED ORDER — CHLORHEXIDINE GLUCONATE CLOTH 2 % EX PADS: 6.0000 | MEDICATED_PAD | Freq: Once | CUTANEOUS | Status: DC

## 2018-12-09 MED ORDER — MIDAZOLAM HCL 2 MG/2ML IJ SOLN: 0.5000 mg | Freq: Once | INTRAMUSCULAR | Status: DC | PRN

## 2018-12-09 MED ORDER — PROPOFOL 10 MG/ML IV BOLUS
INTRAVENOUS | Status: DC | PRN
  Administered 2018-12-09 (×2): 15 mg via INTRAVENOUS

## 2018-12-09 MED ORDER — OMEPRAZOLE 20 MG PO CPDR: DELAYED_RELEASE_CAPSULE | ORAL | 11 refills | Status: DC

## 2018-12-09 MED ORDER — HYDROCODONE-ACETAMINOPHEN 7.5-325 MG PO TABS: 1.0000 | ORAL_TABLET | Freq: Once | ORAL | Status: DC | PRN

## 2018-12-09 MED ORDER — MIDAZOLAM HCL 5 MG/5ML IJ SOLN
INTRAMUSCULAR | Status: DC | PRN
  Administered 2018-12-09: 2 mg via INTRAVENOUS

## 2018-12-09 MED ORDER — HYDROMORPHONE HCL 1 MG/ML IJ SOLN: 0.2500 mg | INTRAMUSCULAR | Status: DC | PRN

## 2018-12-09 MED ORDER — LACTATED RINGERS IV SOLN
INTRAVENOUS | Status: DC
  Administered 2018-12-09: 09:00:00 via INTRAVENOUS

## 2018-12-09 NOTE — Discharge Instructions (Signed)
You have internal hemorrhoids. YOU HAD ONE COLON POLYP & 3 RECTAL POLYPS removed. YOU DO NOT HAVE BARRETT'S ESOPHAGUS. You have mild gastritis AND DUODENITIS DUE TO DAILY ALCOHOL USE. I biopsied your stomach AND SMALL BOWEL.   IF YOU WANT TO REDUCE YOU RIGHT UPPER SIDE PAIN:    1. AVOID ALCOHOL.    2. FOLLOW A LOW FAT/HIGH FIBER DIET. SEE INFO BELOW.    3. TAKE OMEPRAZOLE REGULARLY.  TAKE IT 30 MINUTES PRIOR TO YOUR MEALS TWICE DAILY FOR 3 MOS THEN ONCE DAILY FOREVER. CONTINUE OMEPRAZOLE.    4. AVOID ITEMS THAT TRIGGER GASTRITIS.   YOUR BIOPSY RESULTS WILL BE BACK IN 5 BUSINESS DAYS.  FOLLOW UP IN 6 MOS.   Next colonoscopy in 5 years.   ENDOSCOPY Care After Read the instructions outlined below and refer to this sheet in the next week. These discharge instructions provide you with general information on caring for yourself after you leave the hospital. While your treatment has been planned according to the most current medical practices available, unavoidable complications occasionally occur. If you have any problems or questions after discharge, call DR. Takara Sermons, [phone].  ACTIVITY  You may resume your regular activity, but move at a slower pace for the next 24 hours.   Take frequent rest periods for the next 24 hours.   Walking will help get rid of the air and reduce the bloated feeling in your belly (abdomen).   No driving for 24 hours (because of the medicine (anesthesia) used during the test).   You may shower.   Do not sign any important legal documents or operate any machinery for 24 hours (because of the anesthesia used during the test).    NUTRITION  Drink plenty of fluids.   You may resume your normal diet as instructed by your doctor.   Begin with a light meal and progress to your normal diet. Heavy or fried foods are harder to digest and may make you feel sick to your stomach (nauseated).   Avoid alcoholic beverages for 24 hours or as instructed.     MEDICATIONS  You may resume your normal medications.   WHAT YOU CAN EXPECT TODAY  Some feelings of bloating in the abdomen.   Passage of more gas than usual.   Spotting of blood in your stool or on the toilet paper  .  IF YOU HAD POLYPS REMOVED DURING THE ENDOSCOPY:  Eat a soft diet IF YOU HAVE NAUSEA, BLOATING, ABDOMINAL PAIN, OR VOMITING.    FINDING OUT THE RESULTS OF YOUR TEST Not all test results are available during your visit. DR. Oneida Alar WILL CALL YOU WITHIN 7 DAYS OF YOUR PROCEDUE WITH YOUR RESULTS. Do not assume everything is normal if you have not heard from DR. Rayel Santizo IN ONE WEEK, CALL HER OFFICE AT [phone].  SEEK IMMEDIATE MEDICAL ATTENTION AND CALL THE OFFICE: [phone] IF:  You have more than a spotting of blood in your stool.   Your belly is swollen (abdominal distention).   You are nauseated or vomiting.   You have a temperature over 101F.   You have abdominal pain or discomfort that is severe or gets worse throughout the day.  Polyps, Colon  A polyp is extra tissue that grows inside your body. Colon polyps grow in the large intestine. The large intestine, also called the colon, is part of your digestive system. It is a long, hollow tube at the end of your digestive tract where your body makes and stores  stool. Most polyps are not dangerous. They are benign. This means they are not cancerous. But over time, some types of polyps can turn into cancer. Polyps that are smaller than a pea are usually not harmful. But larger polyps could someday become or may already be cancerous. To be safe, doctors remove all polyps and test them.   WHO GETS POLYPS? Anyone can get polyps, but certain people are more likely than others. You may have a greater chance of getting polyps if:  You are over 50.   You have had polyps before.   Someone in your family has had polyps.   Someone in your family has had cancer of the large intestine.   Find out if someone  in your family has had polyps. You may also be more likely to get polyps if you:   Eat a lot of fatty foods   Smoke   Drink alcohol   Do not exercise  Eat too much   PREVENTION There is not one sure way to prevent polyps. You might be able to lower your risk of getting them if you:  Eat more fruits and vegetables and less fatty food.   Do not smoke.   Avoid alcohol.   Exercise every day.   Lose weight if you are overweight.   Eating more calcium and folate can also lower your risk of getting polyps. Some foods that are rich in calcium are milk, cheese, and broccoli. Some foods that are rich in folate are chickpeas, kidney beans, and spinach.    Gastritis  Gastritis is an inflammation (the body's way of reacting to injury and/or infection) of the stomach. It is often caused by viral or bacterial (germ) infections. It can also be caused BY ASPIRIN, BC/GOODY POWDER'S, (IBUPROFEN) MOTRIN, OR ALEVE (NAPROXEN), chemicals (including alcohol), SPICY FOODS, and medications. This illness may be associated with generalized malaise (feeling tired, not well), UPPER ABDOMINAL STOMACH cramps, and fever. One common bacterial cause of gastritis is an organism known as H. Pylori. This can be treated with antibiotics.    High-Fiber Diet A high-fiber diet changes your normal diet to include more whole grains, legumes, fruits, and vegetables. Changes in the diet involve replacing refined carbohydrates with unrefined foods. The calorie level of the diet is essentially unchanged. The Dietary Reference Intake (recommended amount) for adult males is 38 grams per day. For adult females, it is 25 grams per day. Pregnant and lactating women should consume 28 grams of fiber per day. Fiber is the intact part of a plant that is not broken down during digestion. Functional fiber is fiber that has been isolated from the plant to provide a beneficial effect in the body.  PURPOSE  Increase stool bulk.   Ease and  regulate bowel movements.   Lower cholesterol.   REDUCE RISK OF COLON CANCER  INDICATIONS THAT YOU NEED MORE FIBER  Constipation and hemorrhoids.   Uncomplicated diverticulosis (intestine condition) and irritable bowel syndrome.   Weight management.   As a protective measure against hardening of the arteries (atherosclerosis), diabetes, and cancer.   GUIDELINES FOR INCREASING FIBER IN THE DIET  Start adding fiber to the diet slowly. A gradual increase of about 5 more grams (2 slices of whole-wheat bread, 2 servings of most fruits or vegetables, or 1 bowl of high-fiber cereal) per day is best. Too rapid an increase in fiber may result in constipation, flatulence, and bloating.   Drink enough water and fluids to keep your urine clear  or pale yellow. Water, juice, or caffeine-free drinks are recommended. Not drinking enough fluid may cause constipation.   Eat a variety of high-fiber foods rather than one type of fiber.   Try to increase your intake of fiber through using high-fiber foods rather than fiber pills or supplements that contain small amounts of fiber.   The goal is to change the types of food eaten. Do not supplement your present diet with high-fiber foods, but replace foods in your present diet.   INCLUDE A VARIETY OF FIBER SOURCES  Replace refined and processed grains with whole grains, canned fruits with fresh fruits, and incorporate other fiber sources. White rice, white breads, and most bakery goods contain little or no fiber.   Brown whole-grain rice, buckwheat oats, and many fruits and vegetables are all good sources of fiber. These include: broccoli, Brussels sprouts, cabbage, cauliflower, beets, sweet potatoes, white potatoes (skin on), carrots, tomatoes, eggplant, squash, berries, fresh fruits, and dried fruits.   Cereals appear to be the richest source of fiber. Cereal fiber is found in whole grains and bran. Bran is the fiber-rich outer coat of cereal grain,  which is largely removed in refining. In whole-grain cereals, the bran remains. In breakfast cereals, the largest amount of fiber is found in those with "bran" in their names. The fiber content is sometimes indicated on the label.   You may need to include additional fruits and vegetables each day.   In baking, for 1 cup white flour, you may use the following substitutions:   1 cup whole-wheat flour minus 2 tablespoons.   1/2 cup white flour plus 1/2 cup whole-wheat flour.   Low-Fat Diet BREADS, CEREALS, PASTA, RICE, DRIED PEAS, AND BEANS These products are high in carbohydrates and most are low in fat. Therefore, they can be increased in the diet as substitutes for fatty foods. They too, however, contain calories and should not be eaten in excess. Cereals can be eaten for snacks as well as for breakfast.  Include foods that contain fiber (fruits, vegetables, whole grains, and legumes). Research shows that fiber may lower blood cholesterol levels, especially the water-soluble fiber found in fruits, vegetables, oat products, and legumes. FRUITS AND VEGETABLES It is good to eat fruits and vegetables. Besides being sources of fiber, both are rich in vitamins and some minerals. They help you get the daily allowances of these nutrients. Fruits and vegetables can be used for snacks and desserts. MEATS Limit lean meat, chicken, [place], and fish to no more than 6 ounces per day. Beef, Pork, and Lamb Use lean cuts of beef, pork, and lamb. Lean cuts include:  Extra-lean ground beef.  Arm roast.  Sirloin tip.  Center-cut ham.  Round steak.  Loin chops.  Rump roast.  Tenderloin.  Trim all fat off the outside of meats before cooking. It is not necessary to severely decrease the intake of red meat, but lean choices should be made. Lean meat is rich in protein and contains a highly absorbable form of iron. Premenopausal women, in particular, should avoid reducing lean red meat because this could  increase the risk for low red blood cells (iron-deficiency anemia). The organ meats, such as liver, sweetbreads, kidneys, and brain are very rich in cholesterol. They should be limited. Chicken and [place] These are good sources of protein. The fat of poultry can be reduced by removing the skin and underlying fat layers before cooking. Chicken and [place] can be substituted for lean red meat in the diet. Poultry  should not be fried or covered with high-fat sauces. Fish and Shellfish Fish is a good source of protein. Shellfish contain cholesterol, but they usually are low in saturated fatty acids. The preparation of fish is important. Like chicken and [place], they should not be fried or covered with high-fat sauces. EGGS Egg whites contain no fat or cholesterol. They can be eaten often. Try 1 to 2 egg whites instead of whole eggs in recipes or use egg substitutes that do not contain yolk. MILK AND DAIRY PRODUCTS Use skim or 1% milk instead of 2% or whole milk. Decrease whole milk, natural, and processed cheeses. Use nonfat or low-fat (2%) cottage cheese or low-fat cheeses made from vegetable oils. Choose nonfat or low-fat (1 to 2%) yogurt. Experiment with evaporated skim milk in recipes that call for heavy cream. Substitute low-fat yogurt or low-fat cottage cheese for sour cream in dips and salad dressings. Have at least 2 servings of low-fat dairy products, such as 2 glasses of skim (or 1%) milk each day to help get your daily calcium intake.  FATS AND OILS Reduce the total intake of fats, especially saturated fat. Butterfat, lard, and beef fats are high in saturated fat and cholesterol. These should be avoided as much as possible. Vegetable fats do not contain cholesterol, but certain vegetable fats, such as coconut oil, palm oil, and palm kernel oil are very high in saturated fats. These should be limited. These fats are often used in bakery goods, processed foods, popcorn, oils, and nondairy creamers.  Vegetable shortenings and some peanut butters contain hydrogenated oils, which are also saturated fats. Read the labels on these foods and check for saturated vegetable oils. Unsaturated vegetable oils and fats do not raise blood cholesterol. However, they should be limited because they are fats and are high in calories. Total fat should still be limited to 30% of your daily caloric intake. Desirable liquid vegetable oils are corn oil, cottonseed oil, olive oil, canola oil, safflower oil, soybean oil, and sunflower oil. Peanut oil is not as good, but small amounts are acceptable. Buy a heart-healthy tub margarine that has no partially hydrogenated oils in the ingredients. Mayonnaise and salad dressings often are made from unsaturated fats, but they should also be limited because of their high calorie and fat content. Seeds, nuts, peanut butter, olives, and avocados are high in fat, but the fat is mainly the unsaturated type. These foods should be limited mainly to avoid excess calories and fat. OTHER EATING TIPS Snacks  Most sweets should be limited as snacks. They tend to be rich in calories and fats, and their caloric content outweighs their nutritional value. Some good choices in snacks are graham crackers, melba toast, soda crackers, bagels (no egg), English muffins, fruits, and vegetables. These snacks are preferable to snack crackers, [place] fries, and chips. Popcorn should be air-popped or cooked in small amounts of liquid vegetable oil. Desserts Eat fruit, low-fat yogurt, and fruit ices. AVOID pastries, cake, and cookies. Sherbet, angel food cake, gelatin dessert, frozen low-fat yogurt, or other frozen products that do not contain saturated fat (pure fruit juice bars, frozen ice pops) are also acceptable.  COOKING METHODS Choose those methods that use little or no fat. They include: Poaching.  Braising.  Steaming.  Grilling.  Baking.  Stir-frying.  Broiling.  Microwaving.  Foods can be  cooked in a nonstick pan without added fat, or use a nonfat cooking spray in regular cookware. Limit fried foods and avoid frying in saturated fat.  Add moisture to lean meats by using water, broth, cooking wines, and other nonfat or low-fat sauces along with the cooking methods mentioned above. Soups and stews should be chilled after cooking. The fat that forms on top after a few hours in the refrigerator should be skimmed off. When preparing meals, avoid using excess salt. Salt can contribute to raising blood pressure in some people. EATING AWAY FROM HOME Order entres, potatoes, and vegetables without sauces or butter. When meat exceeds the size of a deck of cards (3 to 4 ounces), the rest can be taken home for another meal. Choose vegetable or fruit salads and ask for low-calorie salad dressings to be served on the side. Use dressings sparingly. Limit high-fat toppings, such as bacon, crumbled eggs, cheese, sunflower seeds, and olives. Ask for heart-healthy tub margarine instead of butter.

## 2018-12-09 NOTE — Transfer of Care (Signed)
Immediate Anesthesia Transfer of Care Note  Patient: Nathaniel Wilson  Procedure(s) Performed: COLONOSCOPY WITH PROPOFOL (N/A ) ESOPHAGOGASTRODUODENOSCOPY (EGD) WITH PROPOFOL (N/A ) POLYPECTOMY BIOPSY  Patient Location: PACU  Anesthesia Type:General  Level of Consciousness: awake and drowsy  Airway & Oxygen Therapy: Patient Spontanous Breathing and Patient connected to nasal cannula oxygen  Post-op Assessment: Report given to RN and Post -op Vital signs reviewed and stable  Post vital signs: Reviewed and stable  Last Vitals:  Vitals Value Taken Time  BP    Temp    Pulse 74 12/09/2018 10:12 AM  Resp 19 12/09/2018 10:12 AM  SpO2 98 % 12/09/2018 10:12 AM  Vitals shown include unvalidated device data.  Last Pain:  Vitals:   12/09/18 0923  TempSrc:   PainSc: 0-No pain      Patients Stated Pain Goal: 5 (14/43/15 4008)  Complications: No apparent anesthesia complications

## 2018-12-09 NOTE — Anesthesia Preprocedure Evaluation (Addendum)
Anesthesia Evaluation  Patient identified by MRN, date of birth, ID band Patient awake  General Assessment Comment:Reports awareness with last colonoscopy  Reviewed: Allergy & Precautions, NPO status , Patient's Chart, lab work & pertinent test results  History of Anesthesia Complications (+) history of anesthetic complications  Airway Mallampati: II  TM Distance: >3 FB Neck ROM: Full    Dental no notable dental hx. (+) Poor Dentition, Missing, Chipped   Pulmonary neg pulmonary ROS, Current Smoker,    Pulmonary exam normal breath sounds clear to auscultation       Cardiovascular Exercise Tolerance: Good hypertension, Pt. 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  States no Chemo/No RT    Endo/Other  negative endocrine ROSdiabetes, Well Controlled, Type 1  Renal/GU negative Renal ROS  negative genitourinary   Musculoskeletal negative musculoskeletal ROS (+)   Abdominal   Peds negative pediatric ROS (+)  Hematology negative hematology ROS (+)   Anesthesia Other Findings   Reproductive/Obstetrics negative OB ROS                            Anesthesia Physical Anesthesia Plan  ASA: III  Anesthesia Plan: General   Post-op Pain Management:    Induction: Intravenous  PONV Risk Score and Plan:   Airway Management Planned: Nasal Cannula  Additional Equipment:   Intra-op Plan:   Post-operative Plan:   Informed Consent: I have reviewed the patients History and Physical, chart, labs and discussed the procedure including the risks, benefits and alternatives for the proposed anesthesia with the patient or authorized representative who has indicated his/her  understanding and acceptance.   Dental advisory given  Plan Discussed with: CRNA  Anesthesia Plan Comments:        Anesthesia Quick Evaluation

## 2018-12-09 NOTE — Op Note (Addendum)
[facility] Patient Name: Nathaniel Wilson Procedure Date: 12/09/2018 9:05 AM MRN: [id number] Date of Birth: 08/06/1981 Attending MD: Barney Drain MD, MD CSN: [id number] Age: 37 Admit Type: Outpatient Procedure:                Colonoscopy WITH COLD SNARE POLYPECTOMY Indications:              Malignant carcinoid tumor of the rectum, Family                            history of colon cancer in a first-degree relative:                            SISTER AGE 9, Personal history of colonic polyps Providers:                Barney Drain MD, MD, Gerome Sam, RN, Aram Candela Referring MD:             Allayne Butcher Medicines:                Propofol per Anesthesia Complications:            No immediate complications. Estimated Blood Loss:     Estimated blood loss was minimal. Procedure:                Pre-Anesthesia Assessment:                           - Prior to the procedure, a History and Physical                            was performed, and patient medications and                            allergies were reviewed. The patient's tolerance of                            previous anesthesia was also reviewed. The risks                            and benefits of the procedure and the sedation                            options and risks were discussed with the patient.                            All questions were answered, and informed consent                            was obtained. Prior Anticoagulants: The patient has                            taken no previous anticoagulant or antiplatelet  agents. ASA Grade Assessment: II - A patient with                            mild systemic disease. After reviewing the risks                            and benefits, the patient was deemed in                            satisfactory condition to undergo the procedure.                            After obtaining informed consent, the  colonoscope                            was passed under direct vision. Throughout the                            procedure, the patient's blood pressure, pulse, and                            oxygen saturations were monitored continuously. The                            [id number]-[id number] ([id number]) scope was introduced through                            the anus and advanced to the the cecum, identified                            by appendiceal orifice and ileocecal valve. The                            colonoscopy was somewhat difficult due to a                            tortuous colon. Successful completion of the                            procedure was aided by increasing the dose of                            sedation medication, straightening and shortening                            the scope to obtain bowel loop reduction and                            COLOWRAP. The patient tolerated the procedure                            fairly well. The quality of the bowel preparation  was excellent. The ileocecal valve, appendiceal                            orifice, and rectum were photographed. Scope In: 9:32:00 AM Scope Out: 9:54:22 AM Scope Withdrawal Time: 0 hours 20 minutes 8 seconds  Total Procedure Duration: 0 hours 22 minutes 22 seconds  Findings:      A 6 mm polyp was found in the mid transverse colon. The polyp was       sessile. The polyp was removed with a cold snare. Resection and       retrieval were complete.      Three sessile polyps were found in the rectum. The polyps were 3 to 5 mm       in size. These polyps were removed with a cold snare and two had       resistance to snare cutting through. Resection and retrieval were       complete.      External and internal hemorrhoids were found. The hemorrhoids were       moderate.      The recto-sigmoid colon and sigmoid colon were mildly redundant. Impression:               - One 6 mm polyp in the mid  transverse colon,                            removed with a cold snare. Resected and retrieved.                           - Three 3 to 5 mm polyps in the rectum, removed                            with a cold snare. Resected and retrieved and TWO                            POSSIBLE CARCINOID LESIONS IN THE RECTUM.                           - External and internal hemorrhoids.                           - Redundant left colon. Moderate Sedation:      Per Anesthesia Care Recommendation:           - Patient has a contact number available for                            emergencies. The signs and symptoms of potential                            delayed complications were discussed with the                            patient. Return to normal activities tomorrow.                            Written discharge instructions were provided to the  patient.                           - High fiber diet.                           - Continue present medications.                           - Await pathology results.                           - Repeat colonoscopy in 5 years for surveillance.                           - Return to GI office in 6 months. Procedure Code(s):        --- Professional ---                           [phone], Colonoscopy, flexible; with removal of                            tumor(s), polyp(s), or other lesion(s) by snare                            technique Diagnosis Code(s):        --- Professional ---                           D12.3, Benign neoplasm of transverse colon (hepatic                            flexure or splenic flexure)                           K62.1, Rectal polyp                           K64.8, Other hemorrhoids                           C7A.026, Malignant carcinoid tumor of the rectum                           Z86.010, Personal history of colonic polyps                           Q43.8, Other specified congenital malformations of                             intestine CPT copyright 2018 [employer]. All rights reserved. The codes documented in this report are preliminary and upon coder review may  be revised to meet current compliance requirements. Barney Drain, MD Barney Drain MD, MD 12/09/2018 10:18:36 AM This report has been signed electronically. Number of Addenda: 0

## 2018-12-09 NOTE — H&P (Signed)
Primary Care Physician:  Allayne Butcher, NP Primary Gastroenterologist:  Dr. Oneida Alar  Pre-Procedure History & Physical: HPI:  Nathaniel Wilson is a 37 y.o. male here for DYSPEPSIA/personal history of polyps/RECTAL CARCINOID.   Past Medical History:  Diagnosis Date  . Diabetes ([place])   . GERD (gastroesophageal reflux disease)   . Hypertension     Past Surgical History:  Procedure Laterality Date  . COLONOSCOPY     Dr. 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 ESOPHAGOGASTRODUODENOSCOPY  03/2015   Dr. Earley Brooke: esophageal erosions, erosions in duodenal bulb, +Barrett's but no dysplasia    Prior to Admission medications   Medication Sig Start Date End Date Taking? Authorizing Provider  fluticasone (FLONASE) 50 MCG/ACT nasal spray Place 1 spray into both nostrils daily as needed for allergies.    Yes [provider]  Fluticasone-Salmeterol (ADVAIR) 250-50 MCG/DOSE AEPB Inhale 1 puff into the lungs daily as needed (shortness of breath).    Yes [provider]  insulin detemir (LEVEMIR) 100 UNIT/ML injection Inject 12 Units into the skin at bedtime.    Yes [provider]  lisinopril (PRINIVIL,ZESTRIL) 10 MG tablet Take 10 mg by mouth daily.  07/16/18  Yes [provider]  omeprazole (PRILOSEC) 20 MG capsule Take 20 mg by mouth daily as needed (heartburn).    Yes [provider]  polyethylene glycol-electrolytes (TRILYTE) 420 g solution Take 4,000 mLs by mouth as directed. 09/29/18  Yes Danie Binder, MD    Allergies as of 09/29/2018  . (No Known Allergies)    Family History  Problem Relation Age of Onset  . Colon cancer Sister 15       deceased from colon cancer age 76   . Cancer Mother        late 54s, deceased   . Drug abuse Brother        drug overdose early 18s   . Colon polyps Neg Hx     Social History   Socioeconomic History  . Marital status: Married     Spouse name: Not on file  . Number of children: Not on file  . Years of education: Not on file  . Highest education level: Not on file  Occupational History  . Not on file  Social Needs  . Financial resource strain: Not on file  . Food insecurity:    Worry: Not on file    Inability: Not on file  . Transportation needs:    Medical: Not on file    Non-medical: Not on file  Tobacco Use  . Smoking status: Current Every Day Smoker    Packs/day: 1.00    Types: Cigarettes    Start date: 08/26/1993  . Smokeless tobacco: Never Used  Substance and Sexual Activity  . Alcohol use: Yes    Comment: 12 pack beer per day,  was drinking malt liquor before this daily.   . Drug use: Never  . Sexual activity: Not on file  Lifestyle  . Physical activity:    Days per week: Not on file    Minutes per session: Not on file  . Stress: Not on file  Relationships  . Social connections:    Talks on phone: Not on file    Gets together: Not on file    Attends religious service: Not on file    Active member of club or organization: Not on file    Attends meetings of clubs or organizations:  Not on file    Relationship status: Not on file  . Intimate partner violence:    Fear of current or ex partner: Not on file    Emotionally abused: Not on file    Physically abused: Not on file    Forced sexual activity: Not on file  Other Topics Concern  . Not on file  Social History Narrative  . Not on file    Review of Systems: See HPI, otherwise negative ROS   Physical Exam: Pulse 67   Temp 97.9 F (36.6 C) (Oral)   Resp 18   SpO2 94%  General:   Alert,  pleasant and cooperative in NAD Head:  Normocephalic and atraumatic. Neck:  Supple; Lungs:  Clear throughout to auscultation.    Heart:  Regular rate and rhythm. Abdomen:  Soft, nontender and nondistended. Normal bowel sounds, without guarding, and without rebound.   Neurologic:  Alert and  oriented x4;  grossly normal  neurologically.  Impression/Plan:     DYSPEPSIA/personal history of polyps  PLAN:  TCS/EGD TODAY. DISCUSSED PROCEDURE, BENEFITS, & RISKS: < 1% chance of medication reaction, bleeding, perforation, or rupture of spleen/liver.

## 2018-12-09 NOTE — Progress Notes (Signed)
Please excuse Nathaniel Wilson from work on 12/10/2018.  He had a procedure at [facility] that required sedation.

## 2018-12-09 NOTE — Op Note (Signed)
[facility] Patient Name: Nathaniel Wilson Procedure Date: 12/09/2018 9:07 AM MRN: [id number] Date of Birth: 12/11/1981 Attending MD: Barney Drain MD, MD CSN: [id number] Age: 37 Admit Type: Outpatient Procedure:                Upper GI endoscopy Indications:              Abdominal pain in the right upper quadrant,                            Dyspepsia. DRINKS ETOH DAILY. PT DENIES TAKING                            ASA/NSAIDS. Providers:                Barney Drain MD, MD, Gerome Sam, RN, Aram Candela Referring MD:             Allayne Butcher Medicines:                Propofol per Anesthesia Complications:            No immediate complications. Estimated Blood Loss:     Estimated blood loss was minimal. Procedure:                Pre-Anesthesia Assessment:                           - Prior to the procedure, a History and Physical                            was performed, and patient medications and                            allergies were reviewed. The patient's tolerance of                            previous anesthesia was also reviewed. The risks                            and benefits of the procedure and the sedation                            options and risks were discussed with the patient.                            All questions were answered, and informed consent                            was obtained. Prior Anticoagulants: The patient has                            taken no previous anticoagulant or antiplatelet                            agents.  ASA Grade Assessment: II - A patient with                            mild systemic disease. After reviewing the risks                            and benefits, the patient was deemed in                            satisfactory condition to undergo the procedure.                            After obtaining informed consent, the endoscope was                            passed under direct vision.  Throughout the                            procedure, the patient's blood pressure, pulse, and                            oxygen saturations were monitored continuously. The                            [id number]-H190 ([id number]) scope was introduced through the                            mouth, and advanced to the second part of duodenum.                            The upper GI endoscopy was accomplished without                            difficulty. The patient tolerated the procedure                            well. Scope In: 9:59:05 AM Scope Out: 10:06:21 AM Total Procedure Duration: 0 hours 7 minutes 16 seconds  Findings:      The examined esophagus was normal.      Segmental mild inflammation characterized by congestion (edema) and       erythema was found in the gastric antrum. Biopsies were taken with a       cold forceps for Helicobacter pylori testing.      Patchy mild inflammation was found in the duodenal bulb.      The second portion of the duodenum was normal. Impression:               -                           - RUQ PAIN MOST LIKELY DUE TO ETOH INDUCED                            Gastritis/DUODENITIS. Moderate Sedation:      Per Anesthesia Care Recommendation:           -  Patient has a contact number available for                            emergencies. The signs and symptoms of potential                            delayed complications were discussed with the                            patient. Return to normal activities tomorrow.                            Written discharge instructions were provided to the                            patient.                           - High fiber diet and low fat diet.                           - Continue present medications. STOP DRINKING ETOH.                           - Await pathology results.                           - Return to my office in 6 months. Procedure Code(s):        --- Professional ---                           [phone],  Esophagogastroduodenoscopy, flexible,                            transoral; with biopsy, single or multiple Diagnosis Code(s):        --- Professional ---                           K29.70, Gastritis, unspecified, without bleeding                           R10.11, Right upper quadrant pain                           R10.13, Epigastric pain CPT copyright 2018 American Medical Association. All rights reserved. The codes documented in this report are preliminary and upon coder review may  be revised to meet current compliance requirements. Barney Drain, MD Barney Drain MD, MD 12/09/2018 10:23:09 AM This report has been signed electronically. Number of Addenda: 0

## 2018-12-09 NOTE — Anesthesia Postprocedure Evaluation (Signed)
Anesthesia Post Note  Patient: Nathaniel Wilson  Procedure(s) Performed: COLONOSCOPY WITH PROPOFOL (N/A ) ESOPHAGOGASTRODUODENOSCOPY (EGD) WITH PROPOFOL (N/A ) POLYPECTOMY BIOPSY  Patient location during evaluation: PACU Anesthesia Type: General Level of consciousness: awake and patient cooperative Pain management: pain level controlled Vital Signs Assessment: post-procedure vital signs reviewed and stable Respiratory status: spontaneous breathing, nonlabored ventilation and respiratory function stable Cardiovascular status: blood pressure returned to baseline Postop Assessment: no apparent nausea or vomiting Anesthetic complications: no     Last Vitals:  Vitals:   12/09/18 0812  Pulse: 67  Resp: 18  Temp: 36.6 C  SpO2: 94%    Last Pain:  Vitals:   12/09/18 0923  TempSrc:   PainSc: 0-No pain                 [place]

## 2018-12-10 ENCOUNTER — Ambulatory Visit: Payer: Managed Care, Other (non HMO) | Admitting: Gastroenterology

## 2018-12-12 ENCOUNTER — Encounter (HOSPITAL_COMMUNITY): Payer: Self-pay | Admitting: Gastroenterology

## 2018-12-15 ENCOUNTER — Encounter: Payer: Self-pay | Admitting: Gastroenterology

## 2018-12-15 NOTE — Progress Notes (Signed)
LMOM to call.

## 2018-12-15 NOTE — Progress Notes (Signed)
PT's wife, Levada Dy is aware.  Manuela Schwartz, please send to PCP.

## 2018-12-18 ENCOUNTER — Other Ambulatory Visit: Payer: Self-pay | Admitting: *Deleted

## 2018-12-18 ENCOUNTER — Telehealth: Payer: Self-pay | Admitting: Gastroenterology

## 2018-12-18 DIAGNOSIS — D3A026 Benign carcinoid tumor of the rectum: Secondary | ICD-10-CM

## 2018-12-18 NOTE — Progress Notes (Signed)
Pt's wife, Julien Girt is aware, pt was unavailable. She will inform pt and if he has questions, he will call back. Ok to schedule the [place] with Dr. Paulita Fujita.

## 2018-12-18 NOTE — Telephone Encounter (Signed)
HE NEEDS ENDOSCOPIC RECTAL U/S WITH DR. WILL OUTLAW, Dx; RECTAL CARCINOID WITHIN 1 MONTH. (Per SF recommendations in Path results)

## 2018-12-18 NOTE — Telephone Encounter (Signed)
Duplicate message see results note from [facility]

## 2018-12-18 NOTE — Progress Notes (Signed)
PT called and was informed.

## 2019-01-05 ENCOUNTER — Ambulatory Visit (HOSPITAL_COMMUNITY): Payer: BLUE CROSS/BLUE SHIELD

## 2019-01-06 ENCOUNTER — Telehealth: Payer: Self-pay

## 2019-01-06 NOTE — Telephone Encounter (Signed)
Nathaniel Wilson asked me to find out what the patient is inquiring about in the [place] note, when he said does my doctor know the results.   I called and LMOM for a return call.

## 2019-01-06 NOTE — Telephone Encounter (Signed)
I printed off the lab and liver elastography that was scanned in chart in 08/2018 and faxed them to Allayne Butcher, NP at fax [phone]

## 2019-01-06 NOTE — Telephone Encounter (Signed)
Nathaniel Wilson, pt called back and said he saw his PCP yesterday and she said she had not received any results from [place]. Manuela Schwartz usually sends them if the note gets sent to her. Please advise!

## 2019-01-06 NOTE — Telephone Encounter (Signed)
Nathaniel Wilson, can you send the labs that were scanned in to the system Sept 2019 to PCP, along with elastography?

## 2019-01-08 NOTE — Telephone Encounter (Signed)
Message sent to pt in [place].

## 2019-01-12 ENCOUNTER — Telehealth: Payer: Self-pay | Admitting: Gastroenterology

## 2019-01-12 ENCOUNTER — Ambulatory Visit: Payer: Managed Care, Other (non HMO) | Admitting: Gastroenterology

## 2019-01-12 NOTE — Telephone Encounter (Signed)
Called patient spouse and made aware we referred to Dr. Paulita Fujita and provided the # to call them.

## 2019-01-12 NOTE — Telephone Encounter (Signed)
Pt's wife(Angela Frutoso Chase) called wanting to know the name of the doctor we were referring patient to, where they are located and what all does he need to do prior to going. Please call her at [phone]

## 2019-03-20 ENCOUNTER — Ambulatory Visit: Payer: BLUE CROSS/BLUE SHIELD | Admitting: Gastroenterology

## 2019-03-24 NOTE — Progress Notes (Deleted)
EGD Dec 2019 with gastritis and duodenitis. Colonoscopy with one 6 mm polyp in mid transverse colon, three 3-5 mm polyps in rectum, 2 possible carcinoid lesions in rectum.   Fatty liver with Metavir F2/F3.   Endoscopic rectal ultrasound recommended with Dr. Paulita Fujita.   lon, polyp(s), transverse - HYPERPLASTIC POLYP. 2. Rectum, polyp(s) - WELL DIFFERENTIATED NEUROENDOCRINE (CARCINOID) TUMOR, GRADE I. SEE NOTE. - NO MITOTIC ACTIVITY OR NECROSIS ARE IDENTIFIED. 3. Duodenum, NOS biopsy - DUODENAL MUCOSA WITH NO SPECIFIC HISTOPATHOLOGIC CHANGES. - NEGATIVE FOR INCREASED INTRAEPITHELIAL LYMPHOCYTES OR VILLOUS ARCHITECTURAL CHANGES. 4. Stomach, biopsy - GASTRIC ANTRAL MUCOSA WITH MILD CHRONIC GASTRITIS AND ATROPHY. - GASTRIC OXYNTIC MUCOSA WITH PARIETAL CELL HYPERPLASIA AS CAN BE SEEN IN HYPERGASTRINEMIC STATES SUCH AS PPI THERAPY. [place] Dyer STAIN IS NEGATIVE FOR HELICOBACTER PYLORI

## 2019-03-25 ENCOUNTER — Telehealth: Payer: Self-pay

## 2019-03-25 ENCOUNTER — Telehealth: Payer: BLUE CROSS/BLUE SHIELD | Admitting: Gastroenterology

## 2019-03-25 ENCOUNTER — Other Ambulatory Visit: Payer: Self-pay

## 2019-03-25 NOTE — Telephone Encounter (Signed)
Pt scheduled for telephone visit at [place] to call pt's mobile# at 7:56am-no answer. Called home number, male stated he was at work. Tried to call mobile# again at 8:03am, no answer, unable to leave message d/t phone rang several times and stopped.

## 2019-03-25 NOTE — Telephone Encounter (Signed)
Noted. Nathaniel Wilson: did patient ever complete EUS with Dr. Paulita Fujita?

## 2019-03-26 ENCOUNTER — Ambulatory Visit (INDEPENDENT_AMBULATORY_CARE_PROVIDER_SITE_OTHER): Payer: BLUE CROSS/BLUE SHIELD | Admitting: Gastroenterology

## 2019-03-26 ENCOUNTER — Encounter: Payer: Self-pay | Admitting: Gastroenterology

## 2019-03-26 ENCOUNTER — Other Ambulatory Visit: Payer: Self-pay

## 2019-03-26 DIAGNOSIS — D3A026 Benign carcinoid tumor of the rectum: Secondary | ICD-10-CM | POA: Diagnosis not present

## 2019-03-26 DIAGNOSIS — R1011 Right upper quadrant pain: Secondary | ICD-10-CM

## 2019-03-26 NOTE — Patient Instructions (Addendum)
Increase omeprazole to twice a day, 30 minutes before breakfast and dinner.   Call in 1 week with an update. We will do a CT scan if you are not better.  We will see you back in June 2020!  Please keep upcoming appointment with Dr. Paulita Fujita.

## 2019-03-26 NOTE — Progress Notes (Addendum)
Virtual Visit via Telephone Note Due to COVID-19, visit is conducted virtually and was requested by patient.   I connected with Nathaniel Wilson on 03/26/19 at 10:10 AM EDT by telephone and verified that I am speaking with the correct person using two identifiers.   I discussed the limitations, risks, security and privacy concerns of performing an evaluation and management service by telephone and the availability of in person appointments due to COVID-19.  I also discussed with the patient that there may be a patient responsible charge related to this service. The patient expressed understanding and agreed to proceed.   History of Present Illness: 38 year old male with history of rectal carcinoid in 2016, Barrett's in 2016, chronic RUQ abdominal pain. History of alcohol use, drinking daily. Recently completed EGD and colonoscopy in Dec 2019. Findings of gastritis and duodenitis. Colonoscopy with at least 3 rectal carcinoid lesions. He had been referred to Dr. Paulita Fujita for EUS but had to reschedule. He is now scheduled for May 11, 2019.  Sometimes diarrhea once per week. Still with RUQ pain. Mostly waking in middle of night with pain. Hasn't had much sleep. A lot of walking at work, and hurts worse after walking. No N/V. Notes a black floater in right eye. Has scheduled appt with ophthalmologist upcoming April 24. Taking omeprazole just once per day. Not taking twice per day as prescribed. No ETOH in 7 days. Only problem is cigarettes. Smoking a pack a day. Weight 218 last week. Oct 2019 was 219. Appetite is picking up now that not drinking. Knee on right side hurting. No NSAIDs. Manual labor in a plant, walking around, supervising.   Past Medical History:  Diagnosis Date  . Diabetes ([place])   . GERD (gastroesophageal reflux disease)   . Hypertension      Past Surgical History:  Procedure Laterality Date  . BIOPSY  12/09/2018   Procedure: BIOPSY;  Surgeon: Danie Binder, MD;  Location: [facility];  Service: Endoscopy;;  (Gastric)  . COLONOSCOPY     Dr. 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 COLONOSCOPY WITH PROPOFOL N/A 12/09/2018   Dr. Oneida Alar: one 6 mm polyp in mid transverse colon, three 3-5 mm polyps in rectum, 2 possible carcinoid lesions in rectum. Path witth transverse hyperplastic polyp, rectal polyps well-differentiated NET, Grade 1, no mitotic activity or necrosis indentified.   . ESOPHAGOGASTRODUODENOSCOPY  03/2015   Dr. Earley Brooke: esophageal erosions, erosions in duodenal bulb, +Barrett's but no dysplasia  . ESOPHAGOGASTRODUODENOSCOPY (EGD) WITH PROPOFOL N/A 12/09/2018   gastritis and duodenitis. No abnormal villi.   Marland Kitchen POLYPECTOMY  12/09/2018   Procedure: POLYPECTOMY;  Surgeon: Danie Binder, MD;  Location: [facility];  Service: Endoscopy;;  (colon)     Current Meds  Medication Sig  . fluticasone (FLONASE) 50 MCG/ACT nasal spray Place 1 spray into both nostrils daily as needed for allergies.   . Fluticasone-Salmeterol (ADVAIR) 250-50 MCG/DOSE AEPB Inhale 1 puff into the lungs daily as needed (shortness of breath).   . insulin detemir (LEVEMIR) 100 UNIT/ML injection Inject 12 Units into the skin at bedtime.   Marland Kitchen lisinopril (PRINIVIL,ZESTRIL) 10 MG tablet Take 10 mg by mouth daily.   Marland Kitchen omeprazole (PRILOSEC) 20 MG capsule 1 PO BID FOR 3 MOS THEN ONCE DAILY FOREVER. (Patient taking differently: Take 20 mg by mouth daily. )       Observations/Objective: No distress. Unable to perform physical exam due to telephone  encounter. No video available.   Assessment and Plan: 38 year old male with history of rectal carcinoid in 2016 at outside facility without follow-up, now s/p recent EGD/colonoscopy with at least 3 rectal carcinoid lesions, Grade 1. Known fatty liver and chronic RUQ multifactorial in setting of gastritis, duodenitis. Possible musculoskeletal component as activity worsens  discomfort; however, he does endorse waking up at night with discomfort. Discussed increasing omeprazole to BID as previously recommended and calling in 1 week. Will pursue CT abd/pelvis if no improvement or persists. Discussed importance of keeping EUS appointment. Will see him in June 2020 in the office for face to face visit.   Follow Up Instructions: Increase Prilosec to BID Call in 1 week with update CT abd/pelvis if no improvement June 2020 return   I discussed the assessment and treatment plan with the patient. The patient was provided an opportunity to ask questions and all were answered. The patient agreed with the plan and demonstrated an understanding of the instructions.   The patient was advised to call back or seek an in-person evaluation if the symptoms worsen or if the condition fails to improve as anticipated.  I provided 10 minutes of non-face-to-face time during this encounter.  Annitta Needs, PhD, ANP-BC [facility]

## 2019-03-27 NOTE — Telephone Encounter (Signed)
I faxed a request to Dr Paulita Fujita to fax [place] EUS report

## 2019-03-27 NOTE — Telephone Encounter (Signed)
Nathaniel Wilson: don't need to request right now. He had to postpone. Thanks!

## 2019-06-19 ENCOUNTER — Ambulatory Visit: Payer: BLUE CROSS/BLUE SHIELD | Admitting: Gastroenterology

## 2019-06-30 ENCOUNTER — Ambulatory Visit: Payer: BLUE CROSS/BLUE SHIELD | Admitting: Gastroenterology

## 2019-09-01 ENCOUNTER — Ambulatory Visit: Payer: BC Managed Care – PPO | Admitting: Gastroenterology

## 2019-09-01 ENCOUNTER — Encounter: Payer: Self-pay | Admitting: Gastroenterology

## 2019-09-01 ENCOUNTER — Other Ambulatory Visit: Payer: Self-pay

## 2019-09-01 ENCOUNTER — Telehealth: Payer: Self-pay

## 2019-09-01 VITALS — BP 128/83 | HR 67 | Temp 97.3°F | Ht 71.0 in | Wt 216.0 lb

## 2019-09-01 DIAGNOSIS — D3A026 Benign carcinoid tumor of the rectum: Secondary | ICD-10-CM

## 2019-09-01 DIAGNOSIS — R109 Unspecified abdominal pain: Secondary | ICD-10-CM

## 2019-09-01 MED ORDER — OMEPRAZOLE 20 MG PO CPDR: 20.0000 mg | DELAYED_RELEASE_CAPSULE | Freq: Two times a day (BID) | ORAL | 3 refills | Status: DC

## 2019-09-01 NOTE — Progress Notes (Signed)
Referring Provider: Allayne Butcher, NP Primary Care Physician:  Allayne Butcher, NP Primary GI: Dr. Oneida Alar   Chief Complaint  Patient presents with  . LUQ    x 1 month, comes/goes  . RUQ    pain when he tries to exercise    HPI:   Nathaniel Wilson is a 38 y.o. male presenting today with a history of rectal carcinoid in 2016, Barrett's in 2016, chronic RUQ abdominal pain. History of alcohol use. Fatty liver. EGD and colonoscopy in Dec 2019. Findings of gastritis and duodenitis. Colonoscopy with at least 3 rectal carcinoid lesions, Grade 1. He had been referred to Dr. Paulita Fujita for EUS but had to reschedule due to Covid. He has still not completed this.   Notes new onset LUQ pain for one month, worse with walking at work. RUQ pain with sit ups. Sometimes upper abdominal pain after eating. No N/V. No known gallstones. A few nights ago woke up with heartburn. Prilosec daily. Good appetite. Used to drink daily, but now has cut back to just the weekends.   Blood work completed last month with PCP.    Outside labs reviewed Sept 2019:  CBC without anemia, Platelets 181, BUN 15, Creatinine 0.85, Alk Phos 73, AST 38, ALT 50, iron sats 37, ferritin 289, INR 1.1.   Nov 2018: negative Hep A antibody, negative Hep B surface antigen, negative Hep B core antibody, negative Hep C antibody  Past Medical History:  Diagnosis Date  . Diabetes ([place])   . GERD (gastroesophageal reflux disease)   . Hypertension     Past Surgical History:  Procedure Laterality Date  . BIOPSY  12/09/2018   Procedure: BIOPSY;  Surgeon: Danie Binder, MD;  Location: [facility];  Service: Endoscopy;;  (Gastric)  . COLONOSCOPY     Dr. 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 COLONOSCOPY WITH PROPOFOL N/A 12/09/2018   Dr. Oneida Alar: one 6 mm polyp in mid transverse colon, three 3-5 mm polyps in rectum, 2 possible carcinoid lesions in rectum. Path  witth transverse hyperplastic polyp, rectal polyps well-differentiated NET, Grade 1, no mitotic activity or necrosis indentified.   . ESOPHAGOGASTRODUODENOSCOPY  03/2015   Dr. Earley Brooke: esophageal erosions, erosions in duodenal bulb, +Barrett's but no dysplasia  . ESOPHAGOGASTRODUODENOSCOPY (EGD) WITH PROPOFOL N/A 12/09/2018   gastritis and duodenitis. No abnormal villi.   Marland Kitchen POLYPECTOMY  12/09/2018   Procedure: POLYPECTOMY;  Surgeon: Danie Binder, MD;  Location: [facility];  Service: Endoscopy;;  (colon)    Current Outpatient Medications  Medication Sig Dispense Refill  . fluticasone (FLONASE) 50 MCG/ACT nasal spray Place 1 spray into both nostrils daily as needed for allergies.     . Fluticasone-Salmeterol (ADVAIR) 250-50 MCG/DOSE AEPB Inhale 1 puff into the lungs daily as needed (shortness of breath).     . insulin detemir (LEVEMIR) 100 UNIT/ML injection Inject 12 Units into the skin at bedtime.     Marland Kitchen lisinopril (PRINIVIL,ZESTRIL) 10 MG tablet Take 10 mg by mouth daily.   2  . omeprazole (PRILOSEC) 20 MG capsule 1 PO BID FOR 3 MOS THEN ONCE DAILY FOREVER. (Patient taking differently: Take 20 mg by mouth daily. ) 60 capsule 11   No current facility-administered medications for this visit.     Allergies as of 09/01/2019  . (No Known Allergies)    Family History  Problem Relation Age of Onset  . Colon cancer Sister 60  deceased from colon cancer age 78   . Cancer Mother        late 35s, deceased   . Drug abuse Brother        drug overdose early 49s   . Colon polyps Neg Hx     Social History   Socioeconomic History  . Marital status: Married    Spouse name: Not on file  . Number of children: Not on file  . Years of education: Not on file  . Highest education level: Not on file  Occupational History  . Not on file  Social Needs  . Financial resource strain: Not on file  . Food insecurity    Worry: Not on file    Inability: Not on file  . Transportation needs     Medical: Not on file    Non-medical: Not on file  Tobacco Use  . Smoking status: Current Every Day Smoker    Packs/day: 1.00    Types: Cigarettes    Start date: 08/26/1993  . Smokeless tobacco: Never Used  Substance and Sexual Activity  . Alcohol use: Yes    Comment: 12 pack beer per day,  was drinking malt liquor before this daily. ;03/26/19-no ETOH in past 7 days  . Drug use: Never  . Sexual activity: Not on file  Lifestyle  . Physical activity    Days per week: Not on file    Minutes per session: Not on file  . Stress: Not on file  Relationships  . Social Herbalist on phone: Not on file    Gets together: Not on file    Attends religious service: Not on file    Active member of club or organization: Not on file    Attends meetings of clubs or organizations: Not on file    Relationship status: Not on file  Other Topics Concern  . Not on file  Social History Narrative  . Not on file    Review of Systems: Gen: Denies fever, chills, anorexia. Denies fatigue, weakness, weight loss.  CV: Denies chest pain, palpitations, syncope, peripheral edema, and claudication. Resp: Denies dyspnea at rest, cough, wheezing, coughing up blood, and pleurisy. GI: see HPI Derm: Denies rash, itching, dry skin Psych: Denies depression, anxiety, memory loss, confusion. No homicidal or suicidal ideation.  Heme: Denies bruising, bleeding, and enlarged lymph nodes.  Physical Exam: BP 128/83   Pulse 67   Temp (!) 97.3 F (36.3 C) (Oral)   Ht 5' 11"  (1.803 m)   Wt 216 lb (98 kg)   BMI 30.13 kg/m  General:   Alert and oriented. No distress noted. Pleasant and cooperative.  Head:  Normocephalic and atraumatic. Abdomen:  +BS, soft, non-tender and non-distended. No rebound or guarding. No HSM or masses noted. Msk:  Symmetrical without gross deformities. Normal posture. Extremities:  Without edema. Neurologic:  Alert and  oriented x4 Psych:  Alert and cooperative. Normal mood and  affect.

## 2019-09-01 NOTE — Progress Notes (Unsigned)
amb  

## 2019-09-01 NOTE — Assessment & Plan Note (Addendum)
Vague persistent RUQ pain more aggravated by activity, now with new onset LUQ pain, worsening with movement as well. Occasional pain postprandially. EGD on file from Dec 2019 with gastritis and duodenitis. No gallstones on [place]. As per plan, pursue CT abd/pelvis. Likely musculoskeletal but with patient's history would like to be thorough. Will also request recent labs from PCP. Take Prilosec at least once daily. May take BID if needed. GERD diet handout provided.   ADDENDUM: labs from Aug 2020 received. Hgb 16.4, Hct 47.6, platelets 161, creatinine 0.80, albumin 4.6, Tbili 0.6, ALk Phos 68, AST 37, ALT 42. LFTs have improved from Sept 2019.

## 2019-09-01 NOTE — Patient Instructions (Addendum)
I have sent in omeprazole (Prilosec) to your pharmacy. Take this at least once a day, 30 minutes before a meal on an empty stomach. You may take a second dose before dinner if needed.  I have ordered a CT scan to further evaluate your pain.   We are getting you back in with Dr. Paulita Fujita as soon as we can for the endoscopic ultrasound.  I will request blood work from your primary care.  I enjoyed seeing you again today! As you know, I value our relationship and want to provide genuine, compassionate, and quality care. I welcome your feedback. If you receive a survey regarding your visit,  I greatly appreciate you taking time to fill this out. See you next time!  Annitta Needs, PhD, ANP-BC [facility]   Food Choices for Gastroesophageal Reflux Disease, Adult When you have gastroesophageal reflux disease (GERD), the foods you eat and your eating habits are very important. Choosing the right foods can help ease the discomfort of GERD. Consider working with a diet and nutrition specialist (dietitian) to help you make healthy food choices. What general guidelines should I follow?  Eating plan  Choose healthy foods low in fat, such as fruits, vegetables, whole grains, low-fat dairy products, and lean meat, fish, and poultry.  Eat frequent, small meals instead of three large meals each day. Eat your meals slowly, in a relaxed setting. Avoid bending over or lying down until 2-3 hours after eating.  Limit high-fat foods such as fatty meats or fried foods.  Limit your intake of oils, butter, and shortening to less than 8 teaspoons each day.  Avoid the following: ? Foods that cause symptoms. These may be different for different people. Keep a food diary to keep track of foods that cause symptoms. ? Alcohol. ? Drinking large amounts of liquid with meals. ? Eating meals during the 2-3 hours before bed.  Cook foods using methods other than frying. This may include baking, grilling,  or broiling. Lifestyle  Maintain a healthy weight. Ask your health care provider what weight is healthy for you. If you need to lose weight, work with your health care provider to do so safely.  Exercise for at least 30 minutes on 5 or more days each week, or as told by your health care provider.  Avoid wearing clothes that fit tightly around your waist and chest.  Do not use any products that contain nicotine or tobacco, such as cigarettes and e-cigarettes. If you need help quitting, ask your health care provider.  Sleep with the head of your bed raised. Use a wedge under the mattress or blocks under the bed frame to raise the head of the bed. What foods are not recommended? The items listed may not be a complete list. Talk with your dietitian about what dietary choices are best for you. Grains Pastries or quick breads with added fat. [place] toast. Vegetables Deep fried vegetables. [place] fries. Any vegetables prepared with added fat. Any vegetables that cause symptoms. For some people this may include tomatoes and tomato products, chili peppers, onions and garlic, and horseradish. Fruits Any fruits prepared with added fat. Any fruits that cause symptoms. For some people this may include citrus fruits, such as oranges, grapefruit, pineapple, and lemons. Meats and other protein foods High-fat meats, such as fatty beef or pork, hot dogs, ribs, ham, sausage, salami and bacon. Fried meat or protein, including fried fish and fried chicken. Nuts and nut butters. Dairy Whole milk and chocolate milk.  Sour cream. Cream. Ice cream. Cream cheese. Milk shakes. Beverages Coffee and tea, with or without caffeine. Carbonated beverages. Sodas. Energy drinks. Fruit juice made with acidic fruits (such as orange or grapefruit). Tomato juice. Alcoholic drinks. Fats and oils Butter. Margarine. Shortening. Ghee. Sweets and desserts Chocolate and cocoa. Donuts. Seasoning and other foods Pepper. Peppermint  and spearmint. Any condiments, herbs, or seasonings that cause symptoms. For some people, this may include curry, hot sauce, or vinegar-based salad dressings. Summary  When you have gastroesophageal reflux disease (GERD), food and lifestyle choices are very important to help ease the discomfort of GERD.  Eat frequent, small meals instead of three large meals each day. Eat your meals slowly, in a relaxed setting. Avoid bending over or lying down until 2-3 hours after eating.  Limit high-fat foods such as fatty meat or fried foods. This information is not intended to replace advice given to you by your health care provider. Make sure you discuss any questions you have with your health care provider. Document Released: 12/17/2005 Document Revised: 04/09/2019 Document Reviewed: 12/18/2016 Elsevier Patient Education  2020 [employer].

## 2019-09-01 NOTE — Assessment & Plan Note (Signed)
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 He had been referred for rectal EUS with Dr. Paulita Fujita; however, due to COVID, this was put on hold. He has not made follow-up appt to complete. We will expedite this due to known carcinoid lesions and lost to follow-up.

## 2019-09-01 NOTE — Telephone Encounter (Signed)
CT abd/pelvis w/contrast scheduled for 09/15/19 at 5:00pm, arrive at 4:45pm. NPO 4 hours prior to test. Pick up contrast at [facility] radiology prior to day of test.  Tried to call pt to inform him of CT appt, no answer, LMOVM for return call.

## 2019-09-02 ENCOUNTER — Telehealth: Payer: Self-pay | Admitting: *Deleted

## 2019-09-02 NOTE — Telephone Encounter (Signed)
PA for CT has been approved via [employer]. Order ID# [id number] Dates 09/02/2019-10/01/2019

## 2019-09-15 ENCOUNTER — Other Ambulatory Visit: Payer: Self-pay

## 2019-09-15 ENCOUNTER — Ambulatory Visit (HOSPITAL_COMMUNITY)
Admission: RE | Admit: 2019-09-15 | Discharge: 2019-09-15 | Disposition: A | Discharge status: Home / Self Care | Payer: BC Managed Care – PPO | Source: Ambulatory Visit | Attending: Gastroenterology | Admitting: Gastroenterology

## 2019-09-15 DIAGNOSIS — D3A026 Benign carcinoid tumor of the rectum: Secondary | ICD-10-CM | POA: Diagnosis present

## 2019-09-15 DIAGNOSIS — R109 Unspecified abdominal pain: Secondary | ICD-10-CM | POA: Insufficient documentation

## 2019-09-15 LAB — POCT I-STAT CREATININE: Creatinine, Ser: 0.8 mg/dL (ref 0.61–1.24)

## 2019-09-15 MED ORDER — IOHEXOL 300 MG/ML  SOLN
100.0000 mL | Freq: Once | INTRAMUSCULAR | Status: AC | PRN
  Administered 2019-09-15: 17:00:00 100 mL via INTRAVENOUS

## 2019-09-16 ENCOUNTER — Telehealth: Payer: Self-pay

## 2019-09-16 NOTE — Progress Notes (Signed)
Fatty liver with enlargement of left lobe of liver. No other acute findings. I have sent patient a MyChart message. I will review with radiology the liver findings.

## 2019-09-16 NOTE — Telephone Encounter (Signed)
See result note. I also sent in [place].

## 2019-09-16 NOTE — Telephone Encounter (Signed)
Pt is inquiring about his CT results from yesterday 09/15/2019. Pt is aware that it takes up to 1 week to discuss results.

## 2019-09-23 ENCOUNTER — Telehealth: Payer: Self-pay | Admitting: *Deleted

## 2019-09-23 NOTE — Telephone Encounter (Signed)
Called Dr. Erlinda Hong office. Was advised his assistance has tried reaching out to patient to schedule EUS but have not heard back from him. [facility] message sent to patient to contact Dr. Erlinda Hong office. FYI to AB.

## 2019-10-01 NOTE — Telephone Encounter (Signed)
Pt hasn't read MyChart message sent by ME. Letter mailed to pt.

## 2019-10-27 NOTE — Telephone Encounter (Signed)
Called [facility], pt is scheduled for procedure with Dr. Paulita Fujita 11/11/19 at 4:15pm.

## 2019-11-11 NOTE — Telephone Encounter (Signed)
[facility] clinical pool:  Can we figure out options to help with this? Obtain more information?

## 2019-11-12 NOTE — Telephone Encounter (Signed)
Spoke to pt, he had to reschedule appt with Dr. Paulita Fujita. He's currently scheduled to see Dr. Paulita Fujita 12/17/19.

## 2019-11-12 NOTE — Telephone Encounter (Signed)
Tried to call pt, no answer, LMOVM for return call.  

## 2019-12-01 ENCOUNTER — Telehealth: Payer: Self-pay

## 2019-12-01 DIAGNOSIS — Z85040 Personal history of malignant carcinoid tumor of rectum: Secondary | ICD-10-CM

## 2019-12-01 NOTE — Addendum Note (Signed)
Addended by: Cheron Every on: 12/01/2019 04:45 PM   Modules accepted: Orders

## 2019-12-01 NOTE — Telephone Encounter (Signed)
Referral sent to [facility] 

## 2019-12-01 NOTE — Telephone Encounter (Signed)
Called [facility] to obtain more info as to why pt was dismissed from their practice (see MyChart message sent by pt today). Spoke to Amy, she stated pt had cancelled his procedure 5 times this year. Case was reviewed by Dr. Paulita Fujita who advised to dismiss pt from practice.

## 2019-12-01 NOTE — Telephone Encounter (Signed)
Patient had told me his appt was cancelled as he did not have the pre-paid money. See message 11/10/2019. Previously, Dr. Erlinda Hong office had difficulty getting up with patient, and I do see in prior notes where he had been scheduled but apparently did not show (see office visit March 26, 2019).   He has been dismissed from [facility]. He has yet to have rectal endoscopic ultrasound for known rectal carcinoid lesions documented December 2019.   Please refer to [facility] for endoscopic rectal ultrasound.  We are now at a year from last colonoscopy, and he needs to complete this evaluation.

## 2019-12-01 NOTE — Telephone Encounter (Signed)
Nathaniel Wilson, if he was scheduled for 12/17, can they tell us why discharged now? Was there an appt missed in the interim?

## 2019-12-02 NOTE — Telephone Encounter (Signed)
Received message from [facility] patient advised them he is suppose to schedule with Dr. Paulita Fujita. I sent message advising of dismissal from his office. I called patient and spoke with spouse. She wanted to inform us patient did not NS for procedures. They called pt 1 day prior to procedure stating he would have to pay $300 upfront d/t his deductible. So he had to cancel because he did not have that much that day. She stated if they would have told him that earlier then they could have saved to pay for it. I advised that he has been referred to [facility] and they will contact with appt. She voiced understanding. FYI to AB

## 2019-12-02 NOTE — Telephone Encounter (Signed)
Dr. Paulita Fujita reviewed and said to discharge pt.

## 2019-12-28 ENCOUNTER — Telehealth: Payer: Self-pay

## 2019-12-28 NOTE — Telephone Encounter (Signed)
Mansouraty, Telford Nab., MD  Timothy Lasso, RN; Milus Banister, MD  Sunil Hue, I have reviewed the referral. Patient seen by Dr. Oneida Alar in 2019 and underwent a colonoscopy with findings of 3 rectal polyps that were cold snare resected with finding of rectal carcinoids. Referred for EUS. Looks like issues in regards to having EUS scheduled with Dr. Paulita Fujita. Patient needs a rectal endoscopic ultrasound with potential EMR if any recurrent or residual tissue is present. Please schedule with DJ or myself in the next few weeks. No clinic visit needed at this time. Looks like he had issues in regards to the amount that it would cost prior to a procedure which is why he was dismissed from [facility]. Can you please work with getting him the information once it is scheduled in the hospital so that there is no question as to what his deductible and copayment may be on the day of his procedure. Thank you. GM

## 2019-12-29 ENCOUNTER — Other Ambulatory Visit: Payer: Self-pay

## 2019-12-29 DIAGNOSIS — D3A026 Benign carcinoid tumor of the rectum: Secondary | ICD-10-CM

## 2019-12-29 NOTE — Telephone Encounter (Signed)
Appt made for [place] EUS on 01/11/20 at [facility] with Dr Rush Landmark

## 2019-12-30 NOTE — Telephone Encounter (Signed)
EUS scheduled, pt instructed and medications reviewed.  Patient instructions sent to the pt via My Chart.  Patient to call with any questions or concerns. Covid instructions also advised.  They will speak with billing when called and make payment arrangements.

## 2020-01-07 ENCOUNTER — Other Ambulatory Visit (HOSPITAL_COMMUNITY)
Admission: RE | Admit: 2020-01-07 | Discharge: 2020-01-07 | Disposition: A | Discharge status: Home / Self Care | Payer: BC Managed Care – PPO | Source: Ambulatory Visit | Attending: Gastroenterology | Admitting: Gastroenterology

## 2020-01-07 DIAGNOSIS — Z01812 Encounter for preprocedural laboratory examination: Secondary | ICD-10-CM | POA: Insufficient documentation

## 2020-01-07 DIAGNOSIS — Z20822 Contact with and (suspected) exposure to covid-19: Secondary | ICD-10-CM | POA: Insufficient documentation

## 2020-01-08 ENCOUNTER — Encounter (HOSPITAL_COMMUNITY): Payer: Self-pay | Admitting: Gastroenterology

## 2020-01-08 ENCOUNTER — Other Ambulatory Visit: Payer: Self-pay

## 2020-01-08 NOTE — Progress Notes (Signed)
Pre-op call completed. Patient states he will remain in quarantine over weekend. Patient to arrive to hospital at 1030 am 01/11/20. All questions addressed.

## 2020-01-08 NOTE — Progress Notes (Signed)
SDW-pre-op call completed by pt spouse, " Angie, " with pt verbal consent. Spouse denies that pt C/O SOB and chest pain. Spouse denies that pt is currently under the care of a cardiologist. Spouse made aware to have pt stop taking vitamins, fish oil and herbal medications. Do not take any NSAIDs ie: Ibuprofen, Advil, Naproxen (Aleve), Motrin, BC and Goody Powder. Spouse made aware to have pt take 6 Units (1/2 of regular dose) of Levemir insulin the night before surgery. Spouse stated that pt was instructed on how much insulin to take. Spouse made aware to have pt checkc CBG every 2 hours prior to arrival to hospital on DOS. Spouse made aware to have pt treat a CBG < 70 with 4 ounces of apple juice, wait 15 minutes after intervention to recheck CBG, if CBG remains < 70, call Endo unit to speak with a nurse. Spouse verbalized understanding of all pre-op instructions.

## 2020-01-09 LAB — NOVEL CORONAVIRUS, NAA (HOSP ORDER, SEND-OUT TO REF LAB; TAT 18-24 HRS): SARS-CoV-2, NAA: NOT DETECTED

## 2020-01-11 ENCOUNTER — Encounter (HOSPITAL_COMMUNITY)
Admission: RE | Disposition: A | Discharge status: Home / Self Care | Payer: Self-pay | Source: Home / Self Care | Attending: Gastroenterology

## 2020-01-11 ENCOUNTER — Encounter (HOSPITAL_COMMUNITY): Payer: Self-pay | Admitting: Gastroenterology

## 2020-01-11 ENCOUNTER — Ambulatory Visit (HOSPITAL_COMMUNITY)
Admission: RE | Admit: 2020-01-11 | Discharge: 2020-01-11 | Disposition: A | Discharge status: Home / Self Care | Payer: BC Managed Care – PPO | Attending: Gastroenterology | Admitting: Gastroenterology

## 2020-01-11 ENCOUNTER — Ambulatory Visit (HOSPITAL_COMMUNITY): Payer: BC Managed Care – PPO | Admitting: Certified Registered Nurse Anesthetist

## 2020-01-11 ENCOUNTER — Other Ambulatory Visit: Payer: Self-pay

## 2020-01-11 DIAGNOSIS — Z85040 Personal history of malignant carcinoid tumor of rectum: Secondary | ICD-10-CM | POA: Diagnosis not present

## 2020-01-11 DIAGNOSIS — J45909 Unspecified asthma, uncomplicated: Secondary | ICD-10-CM | POA: Diagnosis not present

## 2020-01-11 DIAGNOSIS — K6289 Other specified diseases of anus and rectum: Secondary | ICD-10-CM

## 2020-01-11 DIAGNOSIS — D3A026 Benign carcinoid tumor of the rectum: Secondary | ICD-10-CM

## 2020-01-11 DIAGNOSIS — L905 Scar conditions and fibrosis of skin: Secondary | ICD-10-CM | POA: Insufficient documentation

## 2020-01-11 DIAGNOSIS — Z9889 Other specified postprocedural states: Secondary | ICD-10-CM | POA: Diagnosis not present

## 2020-01-11 DIAGNOSIS — F17210 Nicotine dependence, cigarettes, uncomplicated: Secondary | ICD-10-CM | POA: Diagnosis not present

## 2020-01-11 DIAGNOSIS — Z86010 Personal history of colonic polyps: Secondary | ICD-10-CM | POA: Diagnosis not present

## 2020-01-11 DIAGNOSIS — Z8719 Personal history of other diseases of the digestive system: Secondary | ICD-10-CM | POA: Insufficient documentation

## 2020-01-11 DIAGNOSIS — I10 Essential (primary) hypertension: Secondary | ICD-10-CM | POA: Diagnosis not present

## 2020-01-11 DIAGNOSIS — E119 Type 2 diabetes mellitus without complications: Secondary | ICD-10-CM | POA: Insufficient documentation

## 2020-01-11 DIAGNOSIS — K641 Second degree hemorrhoids: Secondary | ICD-10-CM | POA: Diagnosis not present

## 2020-01-11 HISTORY — DX: Unspecified asthma, uncomplicated: J45.909

## 2020-01-11 HISTORY — DX: Benign carcinoid tumor of the rectum: D3A.026

## 2020-01-11 HISTORY — PX: HEMOSTASIS CLIP PLACEMENT: SHX6857

## 2020-01-11 HISTORY — DX: Fatty (change of) liver, not elsewhere classified: K76.0

## 2020-01-11 HISTORY — PX: POLYPECTOMY: SHX5525

## 2020-01-11 HISTORY — PX: EUS: SHX5427

## 2020-01-11 HISTORY — PX: FLEXIBLE SIGMOIDOSCOPY: SHX5431

## 2020-01-11 HISTORY — DX: Presence of spectacles and contact lenses: Z97.3

## 2020-01-11 LAB — GLUCOSE, CAPILLARY: Glucose-Capillary: 129 mg/dL — ABNORMAL HIGH (ref 70–99)

## 2020-01-11 SURGERY — SIGMOIDOSCOPY, FLEXIBLE
Anesthesia: Monitor Anesthesia Care

## 2020-01-11 MED ORDER — PROPOFOL 10 MG/ML IV BOLUS
INTRAVENOUS | Status: DC | PRN
  Administered 2020-01-11: 10 mg via INTRAVENOUS
  Administered 2020-01-11: 30 mg via INTRAVENOUS
  Administered 2020-01-11: 50 mg via INTRAVENOUS
  Administered 2020-01-11: 10 mg via INTRAVENOUS

## 2020-01-11 MED ORDER — LACTATED RINGERS IV SOLN: INTRAVENOUS | Status: DC

## 2020-01-11 MED ORDER — SODIUM CHLORIDE 0.9 % IV SOLN: INTRAVENOUS | Status: DC

## 2020-01-11 MED ORDER — PROPOFOL 500 MG/50ML IV EMUL
INTRAVENOUS | Status: DC | PRN
  Administered 2020-01-11: 150 ug/kg/min via INTRAVENOUS

## 2020-01-11 NOTE — Transfer of Care (Signed)
Immediate Anesthesia Transfer of Care Note  Patient: Nathaniel Wilson  Procedure(s) Performed: FLEXIBLE SIGMOIDOSCOPY (N/A ) LOWER ENDOSCOPIC ULTRASOUND (EUS) (N/A ) POLYPECTOMY HEMOSTASIS CLIP PLACEMENT  Patient Location: Endoscopy Unit  Anesthesia Type:MAC  Level of Consciousness: sedated  Airway & Oxygen Therapy: Patient Spontanous Breathing  Post-op Assessment: Report given to RN and Post -op Vital signs reviewed and stable  Post vital signs: Reviewed and stable  Last Vitals:  Vitals Value Taken Time  BP    Temp    Pulse 95 01/11/20 1311  Resp 17 01/11/20 1311  SpO2 93 % 01/11/20 1311  Vitals shown include unvalidated device data.  Last Pain:  Vitals:   01/11/20 1050  TempSrc: Oral         Complications: No apparent anesthesia complications

## 2020-01-11 NOTE — Anesthesia Postprocedure Evaluation (Signed)
Anesthesia Post Note  Patient: Nathaniel Wilson  Procedure(s) Performed: FLEXIBLE SIGMOIDOSCOPY (N/A ) LOWER ENDOSCOPIC ULTRASOUND (EUS) (N/A ) POLYPECTOMY HEMOSTASIS CLIP PLACEMENT     Patient location during evaluation: PACU Anesthesia Type: MAC Level of consciousness: awake and alert Pain management: pain level controlled Vital Signs Assessment: post-procedure vital signs reviewed and stable Respiratory status: spontaneous breathing, nonlabored ventilation, respiratory function stable and patient connected to nasal cannula oxygen Cardiovascular status: stable and blood pressure returned to baseline Postop Assessment: no apparent nausea or vomiting Anesthetic complications: no    Last Vitals:  Vitals:   01/11/20 1328 01/11/20 1333  BP: 133/78 (!) 144/75  Pulse: 74 74  Resp: (!) 22 14  Temp:    SpO2: 99% 99%    Last Pain:  Vitals:   01/11/20 1333  TempSrc:   PainSc: 0-No pain                 [place]hower COKER

## 2020-01-11 NOTE — Anesthesia Preprocedure Evaluation (Signed)
Anesthesia Evaluation  Patient identified by MRN, date of birth, ID band Patient awake    Reviewed: Allergy & Precautions, NPO status , Patient's Chart, lab work & pertinent test results  Airway Mallampati: II  TM Distance: >3 FB Neck ROM: Full    Dental  (+) Teeth Intact, Dental Advisory Given   Pulmonary Current Smoker,    breath sounds clear to auscultation       Cardiovascular hypertension,  Rhythm:Regular Rate:Normal     Neuro/Psych    GI/Hepatic   Endo/Other  diabetes  Renal/GU      Musculoskeletal   Abdominal   Peds  Hematology   Anesthesia Other Findings   Reproductive/Obstetrics                             Anesthesia Physical Anesthesia Plan  ASA: II  Anesthesia Plan: MAC   Post-op Pain Management:    Induction: Intravenous  PONV Risk Score and Plan: Ondansetron and Propofol infusion  Airway Management Planned: Natural Airway and Simple Face Mask  Additional Equipment:   Intra-op Plan:   Post-operative Plan:   Informed Consent: I have reviewed the patients History and Physical, chart, labs and discussed the procedure including the risks, benefits and alternatives for the proposed anesthesia with the patient or authorized representative who has indicated his/her understanding and acceptance.     Dental advisory given  Plan Discussed with: CRNA and Anesthesiologist  Anesthesia Plan Comments:         Anesthesia Quick Evaluation

## 2020-01-11 NOTE — Op Note (Signed)
[facility] Patient Name: Nathaniel Wilson Procedure Date : 01/11/2020 MRN: [id number] Attending MD: Justice Britain , MD Date of Birth: 03-14-1981 CSN: [id number] Age: 39 Admit Type: Outpatient Procedure:                Lower EUS Indications:              Rectal deformity found on endoscopy; subepithelial                            tumor versus extrinsic compression Providers:                Justice Britain, MD, Burtis Junes, RN, Cletis Athens,                            Technician, Trixie Deis, CRNA Referring MD:             Barney Drain MD, MD, Dr. Hervey Ard (PCP) Medicines:                Monitored Anesthesia Care Complications:            No immediate complications. Estimated Blood Loss:     Estimated blood loss was minimal. Procedure:                Pre-Anesthesia Assessment:                           - Prior to the procedure, a History and Physical                            was performed, and patient medications and                            allergies were reviewed. The patient's tolerance of                            previous anesthesia was also reviewed. The risks                            and benefits of the procedure and the sedation                            options and risks were discussed with the patient.                            All questions were answered, and informed consent                            was obtained. Prior Anticoagulants: The patient has                            taken no previous anticoagulant or antiplatelet                            agents. ASA Grade Assessment: II - A patient with  mild systemic disease. After reviewing the risks                            and benefits, the patient was deemed in                            satisfactory condition to undergo the procedure.                           After obtaining informed consent, the endoscope was                            passed under direct vision.  Throughout the                            procedure, the patient's blood pressure, pulse, and                            oxygen saturations were monitored continuously. The                            [id number] ([id number]) Olympus therapeutic                            gastroscope was introduced through the anus and                            advanced to the the descending colon. The                            [id number] ([id number]) Olympus Radial EUS scope was                            introduced through the anus and advanced to the the                            sigmoid colon for ultrasound. The lower EUS was                            accomplished without difficulty. The patient                            tolerated the procedure. The quality of the bowel                            preparation was adequate. Scope In: 25:85:27 PM Scope Out: 1:05:53 PM Total Procedure Duration: 0 hours 49 minutes 36 seconds  Findings:      The digital rectal exam findings include hemorrhoids.      ENDOSCOPIC FINDING: :      A large amount of semi-liquid stool was found in the entire colon,       interfering with visualization. Lavage of the area was performed using       copious amounts, resulting in clearance with adequate visualization.  Three post-polypectomy scars were found in the rectum (mid-rectum).       There was no evidence of the previous polypoid tissue in any of these       regions. Each scar site was biposied via cold-snare for histology       purposes. One of the site had continued oozing for >5 minutes, so 2       hemostatic clips were successfully placed (MR conditional). There was no       bleeding at the end of the procedure.      The rectum, recto-sigmoid colon, sigmoid colon and descending colon had       no gross lesions.      Non-bleeding non-thrombosed internal hemorrhoids were found during       retroflexion, during perianal exam and during digital exam. The        hemorrhoids were Grade II (internal hemorrhoids that prolapse but reduce       spontaneously).      Anal papilla(e) were hypertrophied. On one of the anal papilla was a       small lesion that may be inflammatory vs developing condyloma. This was       not sampled.      ENDOSONOGRAPHIC FINDING: :      No malignant-appearing lymph nodes were visualized in the perirectal       region and in the left iliac region. The nodes were.      The rectosigmoid junction was normal.      The rectum was normal.      The internal anal sphincter was visualized endosonographically and       appeared normal. Impression:               Flex Impression:                           - Hemorrhoids found on digital rectal exam.                           - Stool in the entire examined colon. Lavaged for                            adequate visualization.                           - Post-polypectomy scars in the rectum. Biopsied                            via cold-snare. Clips (MR conditional) were placed                            on 1 of the sites due to persistent oozing.                           - The rectum, recto-sigmoid colon, sigmoid colon                            and descending colon are grossly normal.                           - Non-bleeding non-thrombosed internal  hemorrhoids.                           - Anal papilla(e) were hypertrophied. One of the                            anal papilla had a small lesion that may be                            inflammatory v developing condyloma.                           EUS Impression:                           - No malignant-appearing lymph nodes were                            visualized endosonographically in the perirectal                            region and in the left iliac region.                           - Endosonographic imaging showed no sign of                            significant pathology at the rectosigmoid junction.                           -  Endosonographic images of the rectum were                            unremarkable.                           - The internal anal sphincter was visualized                            endosonographically and appeared normal. Recommendation:           - The patient will be observed post-procedure,                            until all discharge criteria are met.                           - Discharge patient to home.                           - Patient has a contact number available for                            emergencies. The signs and symptoms of potential                            delayed complications were  discussed with the                            patient. Return to normal activities tomorrow.                            Written discharge instructions were provided to the                            patient.                           - High fiber diet.                           - Await path results.                           - Repeat lower endoscopic ultrasound in 1 year for                            surveillance as long as no evidence of recurrent                            NET/Carcinoid is found.                           - Return to GI clinic as previously scheduled with                            primary Dr. Oneida Alar.                           - Recommend a non-urgent Colorectal surgery                            evaluation to evaluate the anal papillae and ensure                            no developing condyloma. We will arrange after the                            pathology has returned from the biopsy sites in the                            rectum.                           - The findings and recommendations were discussed                            with the patient.                           - The findings and recommendations were discussed  with the patient's family. Procedure Code(s):        --- Professional ---                           [phone],  Sigmoidoscopy, flexible; with endoscopic                            ultrasound examination                           [phone], Sigmoidoscopy, flexible; with removal of                            tumor(s), polyp(s), or other lesion(s) by snare                            technique Diagnosis Code(s):        --- Professional ---                           K64.1, Second degree hemorrhoids                           Z98.890, Other specified postprocedural states                           K62.89, Other specified diseases of anus and rectum                           I89.9, Noninfective disorder of lymphatic vessels                            and lymph nodes, unspecified CPT copyright 2019 American Medical Association. All rights reserved. The codes documented in this report are preliminary and upon coder review may  be revised to meet current compliance requirements. Justice Britain, MD 01/11/2020 1:37:32 PM Number of Addenda: 0

## 2020-01-11 NOTE — Discharge Instructions (Signed)
YOU HAD AN ENDOSCOPIC PROCEDURE TODAY: Refer to the procedure report and other information in the discharge instructions given to you for any specific questions about what was found during the examination. If this information does not answer your questions, please call [place] office at [phone] to clarify.   YOU SHOULD EXPECT: Some feelings of bloating in the abdomen. Passage of more gas than usual. Walking can help get rid of the air that was put into your GI tract during the procedure and reduce the bloating. If you had a lower endoscopy (such as a colonoscopy or flexible sigmoidoscopy) you may notice spotting of blood in your stool or on the toilet paper. Some abdominal soreness may be present for a day or two, also.  DIET: Your first meal following the procedure should be a light meal and then it is ok to progress to your normal diet. A half-sandwich or bowl of soup is an example of a good first meal. Heavy or fried foods are harder to digest and may make you feel nauseous or bloated. Drink plenty of fluids but you should avoid alcoholic beverages for 24 hours. If you had a esophageal dilation, please see attached instructions for diet.    ACTIVITY: Your care partner should take you home directly after the procedure. You should plan to take it easy, moving slowly for the rest of the day. You can resume normal activity the day after the procedure however YOU SHOULD NOT DRIVE, use power tools, machinery or perform tasks that involve climbing or major physical exertion for 24 hours (because of the sedation medicines used during the test).   SYMPTOMS TO REPORT IMMEDIATELY: A gastroenterologist can be reached at any hour. Please call [phone]  for any of the following symptoms:  Following lower endoscopy (colonoscopy, flexible sigmoidoscopy) Excessive amounts of blood in the stool  Significant tenderness, worsening of abdominal pains  Swelling of the abdomen that is new, acute  Fever of 100 or  higher  Following upper endoscopy (EGD, EUS, ERCP, esophageal dilation) Vomiting of blood or coffee ground material  New, significant abdominal pain  New, significant chest pain or pain under the shoulder blades  Painful or persistently difficult swallowing  New shortness of breath  Black, tarry-looking or red, bloody stools  FOLLOW UP:  If any biopsies were taken you will be contacted by phone or by letter within the next 1-3 weeks. Call [phone]  if you have not heard about the biopsies in 3 weeks.  Please also call with any specific questions about appointments or follow up tests.   Nathaniel Wilson needs to be out of work today 01/11/20 due to sedation from procedure. He is able to return back to work tomorrow 01/12/20.     Glori Bickers, RN

## 2020-01-11 NOTE — H&P (Signed)
GASTROENTEROLOGY PROCEDURE H&P NOTE   Primary Care Physician: Allayne Butcher, NP  HPI: Nathaniel Wilson is a 39 y.o. male who presents for Flex Sigmoidoscopy with EUS and possible EMR.  Past Medical History:  Diagnosis Date  . Asthma   . Diabetes ([place])   . Fatty liver   . GERD (gastroesophageal reflux disease)   . Hypertension   . Rectal carcinoid tumor   . Wears glasses    Past Surgical History:  Procedure Laterality Date  . BIOPSY  12/09/2018   Procedure: BIOPSY;  Surgeon: Danie Binder, MD;  Location: [facility];  Service: Endoscopy;;  (Gastric)  . COLONOSCOPY     Dr. 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 COLONOSCOPY WITH PROPOFOL N/A 12/09/2018   Dr. Oneida Alar: one 6 mm polyp in mid transverse colon, three 3-5 mm polyps in rectum, 2 possible carcinoid lesions in rectum. Path witth transverse hyperplastic polyp, rectal polyps well-differentiated NET, Grade 1, no mitotic activity or necrosis indentified.   . ESOPHAGOGASTRODUODENOSCOPY  03/2015   Dr. Earley Brooke: esophageal erosions, erosions in duodenal bulb, +Barrett's but no dysplasia  . ESOPHAGOGASTRODUODENOSCOPY (EGD) WITH PROPOFOL N/A 12/09/2018   gastritis and duodenitis. No abnormal villi.   Marland Kitchen POLYPECTOMY  12/09/2018   Procedure: POLYPECTOMY;  Surgeon: Danie Binder, MD;  Location: [facility];  Service: Endoscopy;;  (colon)   Current Facility-Administered Medications  Medication Dose Route Frequency Provider Last Rate Last Admin  . lactated ringers infusion   Intravenous Continuous Mansouraty, Telford Nab., MD       No Known Allergies Family History  Problem Relation Age of Onset  . Colon cancer Sister 61       deceased from colon cancer age 66   . Cancer Mother        late 67s, deceased   . Drug abuse Brother        drug overdose early 53s   . Colon polyps Neg Hx    Social History   Socioeconomic History  . Marital status:  Married    Spouse name: Not on file  . Number of children: Not on file  . Years of education: Not on file  . Highest education level: Not on file  Occupational History  . Not on file  Tobacco Use  . Smoking status: Current Every Day Smoker    Packs/day: 1.00    Types: Cigarettes    Start date: 08/26/1993  . Smokeless tobacco: Never Used  Substance and Sexual Activity  . Alcohol use: Yes    Comment: " 7 large cans of beer daily"  . Drug use: Never  . Sexual activity: Not on file  Other Topics Concern  . Not on file  Social History Narrative  . Not on file   Social Determinants of Health   Financial Resource Strain:   . Difficulty of Paying Living Expenses: Not on file  Food Insecurity:   . Worried About Charity fundraiser in the Last Year: Not on file  . Ran Out of Food in the Last Year: Not on file  Transportation Needs:   . Lack of Transportation (Medical): Not on file  . Lack of Transportation (Non-Medical): Not on file  Physical Activity:   . Days of Exercise per Week: Not on file  . Minutes of Exercise per Session: Not on file  Stress:   . Feeling of Stress : Not on file  Social Connections:   .  Frequency of Communication with Friends and Family: Not on file  . Frequency of Social Gatherings with Friends and Family: Not on file  . Attends Religious Services: Not on file  . Active Member of Clubs or Organizations: Not on file  . Attends Archivist Meetings: Not on file  . Marital Status: Not on file  Intimate Partner Violence:   . Fear of Current or Ex-Partner: Not on file  . Emotionally Abused: Not on file  . Physically Abused: Not on file  . Sexually Abused: Not on file    Physical Exam: Vital signs in last 24 hours: Temp:  [98.4 F (36.9 C)] 98.4 F (36.9 C) (01/11 1050) Pulse Rate:  [72] 72 (01/11 1050) Resp:  [15] 15 (01/11 1050) BP: (145)/(91) 145/91 (01/11 1050) SpO2:  [93 %] 93 % (01/11 1050) Weight:  [97.5 kg] 97.5 kg (01/11 1050)     GEN: NAD EYE: Sclerae anicteric ENT: MMM CV: Non-tachycardic GI: Soft, NT/ND NEURO:  Alert & Oriented x 3  Lab Results: No results for input(s): WBC, HGB, HCT, PLT in the last 72 hours. BMET No results for input(s): NA, K, CL, CO2, GLUCOSE, BUN, CREATININE, CALCIUM in the last 72 hours. LFT No results for input(s): PROT, ALBUMIN, AST, ALT, ALKPHOS, BILITOT, BILIDIR, IBILI in the last 72 hours. PT/INR No results for input(s): LABPROT, INR in the last 72 hours.   Impression / Plan: This is a 39 y.o.male who presents for Flex Sigmoidoscopy with EUS and possible EMR.  The risks of EUS including bleeding, infection, aspiration pneumonia and intestinal perforation were discussed as was the possibility it may not give a definitive diagnosis.  The risks and benefits of endoscopic evaluation were discussed with the patient; these include but are not limited to the risk of perforation, infection, bleeding, missed lesions, lack of diagnosis, severe illness requiring hospitalization, as well as anesthesia and sedation related illnesses.  The patient is agreeable to proceed.    Justice Britain, MD [facility] # [phone]

## 2020-01-12 ENCOUNTER — Encounter: Payer: Self-pay | Admitting: Gastroenterology

## 2020-01-12 LAB — SURGICAL PATHOLOGY

## 2020-01-13 ENCOUNTER — Encounter: Payer: Self-pay | Admitting: *Deleted

## 2020-01-15 ENCOUNTER — Telehealth: Payer: Self-pay | Admitting: Gastroenterology

## 2020-01-15 NOTE — Telephone Encounter (Signed)
Please make routine follow-up visit in next 4-6 weeks.

## 2020-01-19 ENCOUNTER — Encounter: Payer: Self-pay | Admitting: Gastroenterology

## 2020-01-19 NOTE — Telephone Encounter (Signed)
PATIENT SCHEDULED AND LETTER SENT  °

## 2020-02-24 ENCOUNTER — Encounter: Payer: Self-pay | Admitting: Gastroenterology

## 2020-02-24 ENCOUNTER — Other Ambulatory Visit: Payer: Self-pay

## 2020-02-24 ENCOUNTER — Ambulatory Visit: Payer: BC Managed Care – PPO | Admitting: Gastroenterology

## 2020-02-24 VITALS — BP 128/78 | HR 81 | Temp 97.7°F | Ht 71.0 in | Wt 221.2 lb

## 2020-02-24 DIAGNOSIS — K760 Fatty (change of) liver, not elsewhere classified: Secondary | ICD-10-CM | POA: Diagnosis not present

## 2020-02-24 DIAGNOSIS — R109 Unspecified abdominal pain: Secondary | ICD-10-CM | POA: Diagnosis not present

## 2020-02-24 DIAGNOSIS — D3A026 Benign carcinoid tumor of the rectum: Secondary | ICD-10-CM | POA: Diagnosis not present

## 2020-02-24 NOTE — Patient Instructions (Signed)
I recommend adding Benefiber 2 teaspoons daily and increasing to three times a day as tolerated. Let me know if this is helpful.  Please take the lab order to your PCP to have done when you see her.   You can take omeprazole once daily before breakfast as long as it is controlling your reflux.   We will see you in 6 months!  I enjoyed seeing you again today! As you know, I value our relationship and want to provide genuine, compassionate, and quality care. I welcome your feedback. If you receive a survey regarding your visit,  I greatly appreciate you taking time to fill this out. See you next time!  Annitta Needs, PhD, ANP-BC [facility]

## 2020-02-24 NOTE — Progress Notes (Signed)
Referring Provider: Allayne Butcher, NP Primary Care Physician:  Allayne Butcher, NP Primary GI: Dr. Oneida Alar   Chief Complaint  Patient presents with  . Follow-up    abd pain on rt side,acid reflux,bm's are small    HPI:   Nathaniel Wilson is a 39 y.o. male presenting today with a history of rectal carcinoid originally diagnosed in 2016, Barrett's in 2016, chronic RUQ abdominal pain. History of alcohol use. Fatty liver. EGD and colonoscopy in Dec 2019. Findings of gastritis and duodenitis. Colonoscopy with at least 3 rectal carcinoid lesions, Grade 1. Completed Flex sig with EUS. No recurrent NET. Concern for small lesion on anal papilla that could be inflammatory vs developing condyloma. Needs to see colorectal surgeon. Flex sig with EUS due in Jan 2022.   CT Sept 2020 without acute findings. Gallbladder unremarkable. No gallstones in 2019 on elastography.   Having RUQ pain intermittently but mainly RLQ pain but improved from before. In middle of night felt a cramp in right side, then turned over and was ok. Sometimes pain after eating. Will have pain then a few hours later have a BM. Sometimes water, sometimes small amount. BMs are softer, mushy. Stool was dark mushy this morning. Stool not productive for about the past month. Thought it was a phase. Abdominal pain worse with walking. Drinking some alcohol now. Brother recently passed away unexpectedly. Grieving.   GERD: hasn't been bad recently. Omeprazole once per day.   History of elevated LFTs. Nov 2018: negative Hep A antibody, negative Hep B surface antigen, negative Hep B core antibody, negative Hep C antibody. Last labs from Aug 2020: Hgb 16.4, Hct 47.6, platelets 161, creatinine 0.80, albumin 4.6, Tbili 0.6, ALk Phos 68, AST 37, ALT 42. LFTs have improved from Sept 2019  Past Medical History:  Diagnosis Date  . Asthma   . Diabetes ([place])   . Fatty liver   . GERD (gastroesophageal reflux disease)   . Hypertension   . Rectal  carcinoid tumor   . Wears glasses     Past Surgical History:  Procedure Laterality Date  . BIOPSY  12/09/2018   Procedure: BIOPSY;  Surgeon: Danie Binder, MD;  [facility];  Service: Endoscopy;;  (Gastric)  . COLONOSCOPY     Dr. 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 COLONOSCOPY WITH PROPOFOL N/A 12/09/2018   Dr. Oneida Alar: one 6 mm polyp in mid transverse colon, three 3-5 mm polyps in rectum, 2 possible carcinoid lesions in rectum. Path witth transverse hyperplastic polyp, rectal polyps well-differentiated NET, Grade 1, no mitotic activity or necrosis indentified.   . ESOPHAGOGASTRODUODENOSCOPY  03/2015   Dr. Earley Brooke: esophageal erosions, erosions in duodenal bulb, +Barrett's but no dysplasia  . ESOPHAGOGASTRODUODENOSCOPY (EGD) WITH PROPOFOL N/A 12/09/2018   gastritis and duodenitis. No abnormal villi.   . EUS N/A 01/11/2020   no malignant-appearing lymph nodes. unremarkable rectal images. no recurrent NET. Needs flex sig with EUS in 1 year. ANAL PAPILLA small lesion that could be inflammatory vs developing condyloma.   Marland Kitchen FLEXIBLE SIGMOIDOSCOPY N/A 01/11/2020   Procedure: FLEXIBLE SIGMOIDOSCOPY;  Surgeon: Rush Landmark Telford Nab., MD;  Location: [place];  Service: Endoscopy;  Laterality: N/A;  . HEMOSTASIS CLIP PLACEMENT  01/11/2020   Procedure: HEMOSTASIS CLIP PLACEMENT;  Surgeon: Irving Copas., MD;  Location: [place];  Service: Endoscopy;;  . POLYPECTOMY  12/09/2018   Procedure: POLYPECTOMY;  Surgeon: Danie Binder, MD;  [facility];  Service: Endoscopy;;  (colon)  . POLYPECTOMY  01/11/2020   Procedure: POLYPECTOMY;  Surgeon: Mansouraty, Telford Nab., MD;  Location: [place];  Service: Endoscopy;;    Current Outpatient Medications  Medication Sig Dispense Refill  . Fluticasone-Salmeterol (ADVAIR) 250-50 MCG/DOSE AEPB Inhale 1 puff into the lungs daily.     .  insulin detemir (LEVEMIR) 100 UNIT/ML injection Inject 12 Units into the skin at bedtime.     Marland Kitchen lisinopril (PRINIVIL,ZESTRIL) 10 MG tablet Take 10 mg by mouth daily.   2  . omeprazole (PRILOSEC) 20 MG capsule Take 1 capsule (20 mg total) by mouth 2 (two) times daily before a meal. (Patient taking differently: Take 20 mg by mouth daily. ) 60 capsule 3  . Vitamin D, Ergocalciferol, (DRISDOL) 1.25 MG (50000 UT) CAPS capsule Take 50,000 Units by mouth 2 (two) times a week.     No current facility-administered medications for this visit.    Allergies as of 02/24/2020  . (No Known Allergies)    Family History  Problem Relation Age of Onset  . Colon cancer Sister 16       deceased from colon cancer age 63   . Cancer Mother        late 44s, deceased   . Drug abuse Brother        drug overdose early 11s   . Colon polyps Neg Hx     Social History   Socioeconomic History  . Marital status: Married    Spouse name: Not on file  . Number of children: Not on file  . Years of education: Not on file  . Highest education level: Not on file  Occupational History  . Not on file  Tobacco Use  . Smoking status: Current Every Day Smoker    Packs/day: 1.00    Types: Cigarettes    Start date: 08/26/1993  . Smokeless tobacco: Never Used  Substance and Sexual Activity  . Alcohol use: Yes    Comment: " 7 large cans of beer daily"  . Drug use: Never  . Sexual activity: Not on file  Other Topics Concern  . Not on file  Social History Narrative  . Not on file   Social Determinants of Health   Financial Resource Strain:   . Difficulty of Paying Living Expenses: Not on file  Food Insecurity:   . Worried About Charity fundraiser in the Last Year: Not on file  . Ran Out of Food in the Last Year: Not on file  Transportation Needs:   . Lack of Transportation (Medical): Not on file  . Lack of Transportation (Non-Medical): Not on file  Physical Activity:   . Days of Exercise per Week: Not on  file  . Minutes of Exercise per Session: Not on file  Stress:   . Feeling of Stress : Not on file  Social Connections:   . Frequency of Communication with Friends and Family: Not on file  . Frequency of Social Gatherings with Friends and Family: Not on file  . Attends Religious Services: Not on file  . Active Member of Clubs or Organizations: Not on file  . Attends Archivist Meetings: Not on file  . Marital Status: Not on file    Review of Systems: Gen: Denies fever, chills, anorexia. Denies fatigue, weakness, weight loss.  CV: Denies chest pain, palpitations, syncope, peripheral edema, and claudication. Resp: Denies dyspnea at rest, cough, wheezing, coughing up blood, and pleurisy. GI:  see HPI Derm: Denies rash, itching, dry skin Psych: Denies depression, anxiety, memory loss, confusion. No homicidal or suicidal ideation.  Heme: Denies bruising, bleeding, and enlarged lymph nodes.  Physical Exam: BP 128/78   Pulse 81   Temp 97.7 F (36.5 C) (Temporal)   Ht 5' 11"  (1.803 m)   Wt 221 lb 3.2 oz (100.3 kg)   BMI 30.85 kg/m  General:   Alert and oriented. No distress noted. Pleasant and cooperative.  Head:  Normocephalic and atraumatic. Eyes:  Conjuctiva clear without scleral icterus. Abdomen:  +BS, soft, non-tender and non-distended. No rebound or guarding. No HSM . Small umbilical hernia.  Msk:  Symmetrical without gross deformities. Normal posture. Extremities:  Without edema. Neurologic:  Alert and  oriented x4 Psych:  Alert and cooperative. Normal mood and affect.  ASSESSMENT: OIVA DIBARI is a 39 y.o. male presenting today with history of rectal carcinoid, with colonoscopy Dec 2019 with rectal carcinoid lesions, completing flex sig with EUS Jan 2021 without recurrent NET. Will need surveillance EUS in Jan 2022. Chronic RUQ abdominal pain with EGD, CT on file, no gallstones on elastography in 2019. Abdominal pain improving and appears to be more musculoskeletal  related. Constipation could be playing a role.   Will add Benefiber daily, increase as tolerated. Continue omeprazole once daily. Due for repeat HFP, which he will have done with PCP next week. I will reach out to Dr. Rush Landmark regarding questionable inflammatory lesion of anal papilla vs developing condyloma.   PLAN:   Omeprazole daily  Add Benefiber and increase as tolerated: Call if no improvement  HFP with PCP upcoming so we may have for our records  EUS in Jan 2022  Return in 6 months   Annitta Needs, PhD, [facility]

## 2020-03-01 ENCOUNTER — Telehealth: Payer: Self-pay

## 2020-03-01 NOTE — Telephone Encounter (Signed)
I have made the [facility] referral per Dr Mansouraty's order.  Thank you

## 2020-03-01 NOTE — Telephone Encounter (Signed)
-----   Message from Irving Copas., MD sent at 03/01/2020  9:57 AM EST ----- Regarding: RE: [name], Thanks for reaching out. Azrielle Springsteen, can you check if we did end up placing referral to [facility] or see if he was evaluated. If not, please move forward with placing the referral for evaluation of condyloma. Please let Nathaniel Wilson and I know what you find out.Thanks as always. GM ----- Message ----- From: Annitta Needs, NP Sent: 02/29/2020  11:20 AM EST To: Annitta Needs, NP, Irving Copas., MD Subject: mutual patient                                 Hi!  Thanks for all your help with this guy! I just wanted to double check about the EUS report; I saw where there was a small lesion on one of the anal papilla concerning for inflammatory process vs condyloma. Did you already refer to colorectal surgeon? We can get that rolling over here if needed. I wanted to make sure I was on the right page.   Thanks! Nathaniel Wilson

## 2020-03-01 NOTE — Telephone Encounter (Signed)
Thank you :)

## 2020-04-06 ENCOUNTER — Telehealth: Payer: Self-pay | Admitting: Gastroenterology

## 2020-04-06 NOTE — Telephone Encounter (Signed)
Nathaniel Wilson,  Patient had HFP done 3/17 with PCP. Can we request this?

## 2020-04-07 NOTE — Telephone Encounter (Signed)
requested

## 2020-04-11 NOTE — Telephone Encounter (Signed)
Outside labs dated 03/17/20:  Alk Phos 94, AST 32, ALT 40, Tbili 0.3, albumin 4.3.   Overall, these are stable; he is doing well. Will follow every 6 months.

## 2020-04-12 NOTE — Telephone Encounter (Signed)
Called pt and he is at work, was notified her would call back later

## 2020-04-13 NOTE — Telephone Encounter (Signed)
Called lmom

## 2020-04-14 NOTE — Telephone Encounter (Signed)
Called lmom

## 2020-04-15 ENCOUNTER — Encounter: Payer: Self-pay | Admitting: Emergency Medicine

## 2020-04-15 NOTE — Telephone Encounter (Signed)
Letter mailed to pt to contact our office in regards to results

## 2020-08-24 ENCOUNTER — Encounter: Payer: Self-pay | Admitting: Gastroenterology

## 2020-08-24 ENCOUNTER — Telehealth: Payer: Self-pay | Admitting: Gastroenterology

## 2020-08-24 ENCOUNTER — Other Ambulatory Visit: Payer: Self-pay

## 2020-08-24 ENCOUNTER — Ambulatory Visit: Payer: BC Managed Care – PPO | Admitting: Gastroenterology

## 2020-08-24 VITALS — BP 120/79 | HR 59 | Temp 97.7°F | Ht 70.0 in | Wt 215.6 lb

## 2020-08-24 DIAGNOSIS — K760 Fatty (change of) liver, not elsewhere classified: Secondary | ICD-10-CM

## 2020-08-24 DIAGNOSIS — D3A026 Benign carcinoid tumor of the rectum: Secondary | ICD-10-CM

## 2020-08-24 DIAGNOSIS — R1011 Right upper quadrant pain: Secondary | ICD-10-CM

## 2020-08-24 NOTE — Telephone Encounter (Signed)
Spoke with pt and he is willing to have an EGD with DR. Carver.

## 2020-08-24 NOTE — Patient Instructions (Addendum)
Please call if you have any pain after eating, nausea, or vomiting.   Continue omeprazole once daily, 30 minutes before breakfast.  You will be due for a rectal ultrasound again in January 2022.  We will see you back in February! If doing well then, we could see you on a yearly basis.  I enjoyed seeing you again today! As you know, I value our relationship and want to provide genuine, compassionate, and quality care. I welcome your feedback. If you receive a survey regarding your visit,  I greatly appreciate you taking time to fill this out. See you next time!  Annitta Needs, PhD, ANP-BC [facility]

## 2020-08-24 NOTE — Telephone Encounter (Signed)
Nathaniel Wilson,   Can you let patient know that I recommend an EGD for Barrett's with Dr. Abbey Chatters if he is willing? Last biopsies were in 2016. In 2019, did not have biopsies completed.

## 2020-08-24 NOTE — Progress Notes (Signed)
Referring Provider: Allayne Butcher, NP Primary Care Physician:  Allayne Butcher, NP  Primary GI: Dr. Abbey Chatters   Chief Complaint  Patient presents with  . Abdominal Pain    ruq    HPI:   Nathaniel Wilson is a 39 y.o. male presenting today with a history of rectal carcinoid originally diagnosed in 2016, Barrett's in 2016, chronic RUQ abdominal pain. History of alcohol use. Fatty liver.EGD and colonoscopy in Dec 2019. Findings of gastritis and duodenitis. Colonoscopy with at least 3 rectal carcinoid lesions, Grade 1. Completed Flex sig with EUS. No recurrent NET. Concern for small lesion on anal papilla that could be inflammatory vs developing condyloma; scheduled for excision by Dr. Dema Severin in Oct 2021. Needs rectal EUS in Jan 2022.   Gallbladder remains in situ. No gallstones in 2019 on US abdomen. Felt to have musculoskeletal component to RUQ pain historically. No RUQ pain postprandially. RUQ discomfort only if walking at work. Fleeting and intermittent.   Notes small amount of bowel movements sometimes. Taking Benefiber daily. Sometimes feels light-headed. No rectal bleeding. No issues with GERD recently. Taking omeprazole daily. Drinks a 6 pack on the weekend. Previously was drinking about 7-8 cans of Wilson a day.    History of elevated LFTs. Nov 2018: negative Hep A antibody, negative Hep B surface antigen, negative Hep B core antibody, negative Hep C antibody. Last labs from Aug 2020: Hgb 16.4, Hct 47.6, platelets 161, creatinine 0.80, albumin 4.6, Tbili 0.6, ALk Phos 68, AST 37, ALT 42. LFTs have improved from Sept 2019. Most recently outside labs dated March 17, 2020: Alk Phos 94, AST 32, ALT 40, Tbili 0.3, albumin 4.3.    Past Medical History:  Diagnosis Date  . Asthma   . Diabetes ([place])   . Fatty liver   . GERD (gastroesophageal reflux disease)   . Hypertension   . Rectal carcinoid tumor   . Wears glasses     Past Surgical History:  Procedure Laterality Date  . BIOPSY   12/09/2018   Procedure: BIOPSY;  Surgeon: Danie Binder, MD;  [facility];  Service: Endoscopy;;  (Gastric)  . COLONOSCOPY     Dr. 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 COLONOSCOPY WITH PROPOFOL N/A 12/09/2018   Dr. Oneida Alar: one 6 mm polyp in mid transverse colon, three 3-5 mm polyps in rectum, 2 possible carcinoid lesions in rectum. Path witth transverse hyperplastic polyp, rectal polyps well-differentiated NET, Grade 1, no mitotic activity or necrosis indentified.   . ESOPHAGOGASTRODUODENOSCOPY  03/2015   Dr. Earley Brooke: esophageal erosions, erosions in duodenal bulb, +Barrett's but no dysplasia  . ESOPHAGOGASTRODUODENOSCOPY (EGD) WITH PROPOFOL N/A 12/09/2018   gastritis and duodenitis. No abnormal villi.   . EUS N/A 01/11/2020   no malignant-appearing lymph nodes. unremarkable rectal images. no recurrent NET. Needs flex sig with EUS in 1 year. ANAL PAPILLA small lesion that could be inflammatory vs developing condyloma.   Marland Kitchen FLEXIBLE SIGMOIDOSCOPY N/A 01/11/2020   Procedure: FLEXIBLE SIGMOIDOSCOPY;  Surgeon: Rush Landmark Telford Nab., MD;  Location: [place];  Service: Endoscopy;  Laterality: N/A;  . HEMOSTASIS CLIP PLACEMENT  01/11/2020   Procedure: HEMOSTASIS CLIP PLACEMENT;  Surgeon: Irving Copas., MD;  Location: [place];  Service: Endoscopy;;  . POLYPECTOMY  12/09/2018   Procedure: POLYPECTOMY;  Surgeon: Danie Binder, MD;  [facility];  Service: Endoscopy;;  (colon)  . POLYPECTOMY  01/11/2020   Procedure: POLYPECTOMY;  Surgeon:  Mansouraty, Telford Nab., MD;  Location: [place];  Service: Endoscopy;;    Current Outpatient Medications  Medication Sig Dispense Refill  . Fluticasone-Salmeterol (ADVAIR) 250-50 MCG/DOSE AEPB Inhale 1 puff into the lungs daily.     . insulin detemir (LEVEMIR) 100 UNIT/ML injection Inject 12 Units into the skin at bedtime.     Marland Kitchen lisinopril  (PRINIVIL,ZESTRIL) 10 MG tablet Take 10 mg by mouth daily.   2  . omeprazole (PRILOSEC) 20 MG capsule Take 1 capsule (20 mg total) by mouth 2 (two) times daily before a meal. (Patient taking differently: Take 20 mg by mouth daily. ) 60 capsule 3  . Vitamin D, Ergocalciferol, (DRISDOL) 1.25 MG (50000 UT) CAPS capsule Take 50,000 Units by mouth 2 (two) times a week. (Patient not taking: Reported on 08/24/2020)     No current facility-administered medications for this visit.    Allergies as of 08/24/2020  . (No Known Allergies)    Family History  Problem Relation Age of Onset  . Colon cancer Sister 42       deceased from colon cancer age 73   . Cancer Mother        late 5s, deceased   . Drug abuse Brother        drug overdose early 39s   . Colon polyps Neg Hx     Social History   Socioeconomic History  . Marital status: Married    Spouse name: Not on file  . Number of children: Not on file  . Years of education: Not on file  . Highest education level: Not on file  Occupational History  . Not on file  Tobacco Use  . Smoking status: Current Every Day Smoker    Packs/day: 1.00    Types: Cigarettes    Start date: 08/26/1993  . Smokeless tobacco: Never Used  Vaping Use  . Vaping Use: Never used  Substance and Sexual Activity  . Alcohol use: Yes    Comment: 6 pack of Wilson on weekends  . Drug use: Never  . Sexual activity: Not on file  Other Topics Concern  . Not on file  Social History Narrative  . Not on file   Social Determinants of Health   Financial Resource Strain:   . Difficulty of Paying Living Expenses: Not on file  Food Insecurity:   . Worried About Charity fundraiser in the Last Year: Not on file  . Ran Out of Food in the Last Year: Not on file  Transportation Needs:   . Lack of Transportation (Medical): Not on file  . Lack of Transportation (Non-Medical): Not on file  Physical Activity:   . Days of Exercise per Week: Not on file  . Minutes of Exercise  per Session: Not on file  Stress:   . Feeling of Stress : Not on file  Social Connections:   . Frequency of Communication with Friends and Family: Not on file  . Frequency of Social Gatherings with Friends and Family: Not on file  . Attends Religious Services: Not on file  . Active Member of Clubs or Organizations: Not on file  . Attends Archivist Meetings: Not on file  . Marital Status: Not on file    Review of Systems: Gen: Denies fever, chills, anorexia. Denies fatigue, weakness, weight loss.  CV: Denies chest pain, palpitations, syncope, peripheral edema, and claudication. Resp: Denies dyspnea at rest, cough, wheezing, coughing up blood, and pleurisy. GI: see HPI Derm: Denies rash, itching,  dry skin Psych: Denies depression, anxiety, memory loss, confusion. No homicidal or suicidal ideation.  Heme: Denies bruising, bleeding, and enlarged lymph nodes.  Physical Exam: BP 120/79   Pulse (!) 59   Temp 97.7 F (36.5 C) (Temporal)   Ht 5' 10"  (1.778 m)   Wt 215 lb 9.6 oz (97.8 kg)   BMI 30.94 kg/m  General:   Alert and oriented. No distress noted. Pleasant and cooperative.  Head:  Normocephalic and atraumatic. Eyes:  Conjuctiva clear without scleral icterus. Mouth:  Mask in place Abdomen:  +BS, soft, non-tender and non-distended. No rebound or guarding. No HSM or masses noted. Msk:  Symmetrical without gross deformities. Normal posture. Extremities:  Without edema. Neurologic:  Alert and  oriented x4 Psych:  Alert and cooperative. Normal mood and affect.  ASSESSMENT: Nathaniel Wilson is a 39 y.o. male presenting today with history of rectal carcinoid, with colonoscopy Dec 2019 with rectal carcinoid lesions, completing flex sig with EUS Jan 2021 without recurrent NET. Will need surveillance EUS in Jan 2022. Chronic RUQ abdominal pain with EGD, CT on file, no gallstones on elastography in 2019, most consistent with musculoskeletal etiology and improved.   Anal lesion  to be excised in October by Dr. Dema Severin.   Barrett's esophagus: 2016 +Barrett's, no dysplasia. EGD in 2019 with normal esophagus but no biopsies taken. As it has been 5 years since biopsy, would recommend EGD now. Will reach out to patient, as this was not realized until after he had left. Continue PPI daily.    PLAN:  PPI daily  Will reach out to patient regarding upper endoscopy in the near future with Dr. Abbey Chatters due to history of Barrett's.   Keep upcoming appointment with surgery for anal excision in October  EUS in Jan 2022 : he is on the recall list for this  Return in Feb 2022 regardless  Annitta Needs, PhD, ANP-BC [facility]

## 2020-08-24 NOTE — Telephone Encounter (Signed)
Please advise Nathaniel Wilson, thanks

## 2020-08-25 NOTE — Progress Notes (Signed)
Cc'ed to pcp °

## 2020-08-26 NOTE — Telephone Encounter (Signed)
Great. Proceed with EGD with Dr. Abbey Chatters due to history of Barrett's and need for surveillance. ASA II.

## 2020-08-29 NOTE — Telephone Encounter (Signed)
Tried to call pt, no answer, LMOVM for return call.  

## 2020-08-31 NOTE — Telephone Encounter (Signed)
Letter mailed

## 2020-09-07 IMAGING — CT CT ABD-PELV W/ CM
2 of 9 series · 14 of 46 positions shown, 16 images · IV contrast (omnipaque)
Comparison: Ultrasound dated 09/09/2018

CLINICAL DATA: 38-year-old male with history of carcinoid tumor of
the rectum presenting for surveillance. Persistent right upper
quadrant pain and now new left upper quadrant pain worsened with
movement.

EXAM:
CT ABDOMEN AND PELVIS WITH CONTRAST
TECHNIQUE: Multidetector CT imaging of the abdomen and pelvis was performed
using the standard protocol following bolus administration of
intravenous contrast.
CONTRAST:  100mL OMNIPAQUE IOHEXOL 300 MG/ML  SOLN

[Series 3: axial venous · axial · portal-venous · 0.77mm/px · z∈[+991,+1402]mm · 11 of 165 slices shown, 13 images]
[im 14/165  soft-tissue]
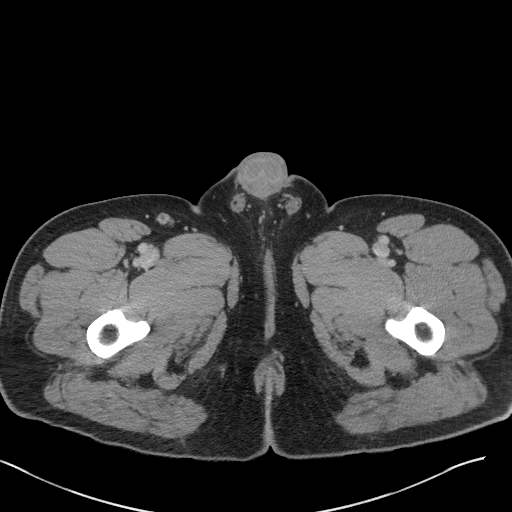
[im 14/165  bone]
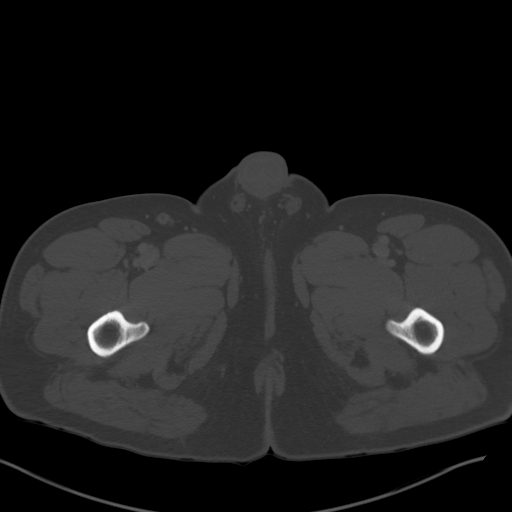
[im 28/165  soft-tissue]
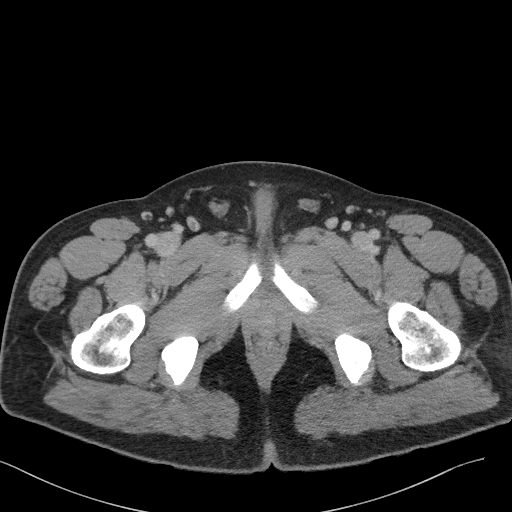
[im 42/165  soft-tissue]
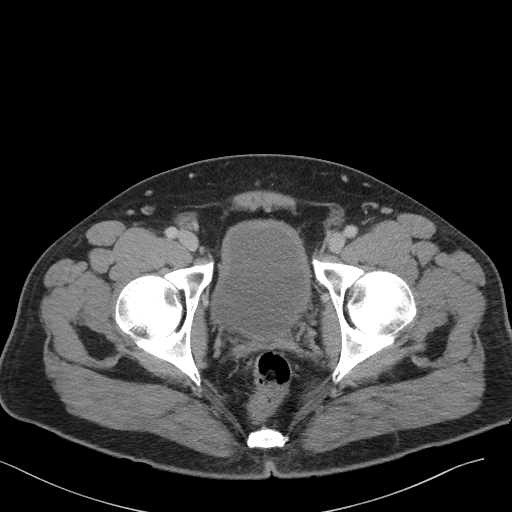
[im 55/165  soft-tissue]
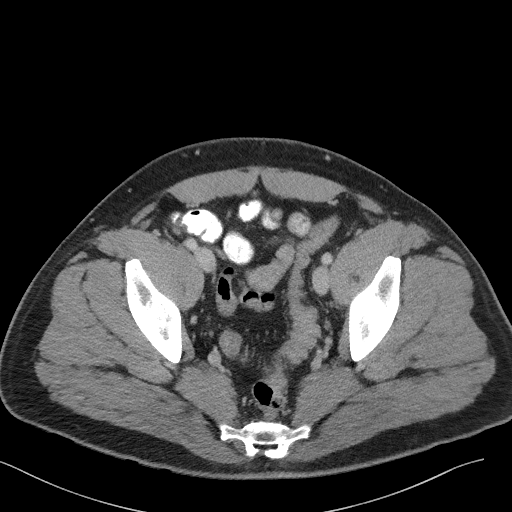
[im 69/165  soft-tissue]
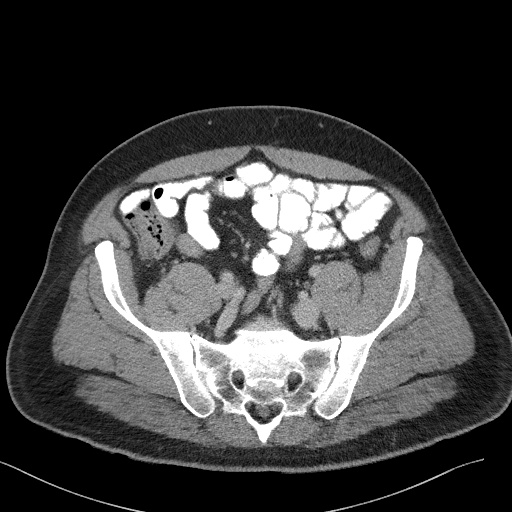
[im 83/165  soft-tissue]
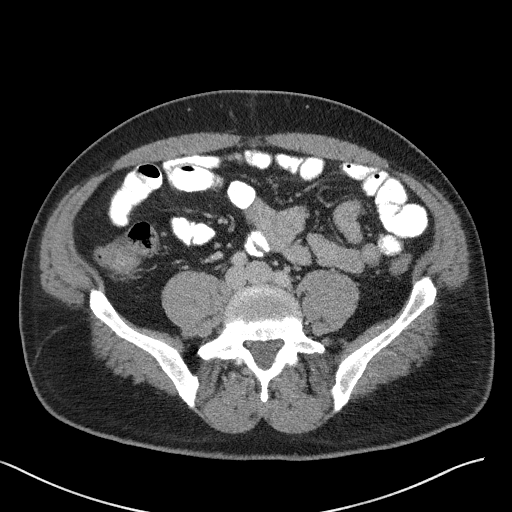
[im 96/165  soft-tissue]
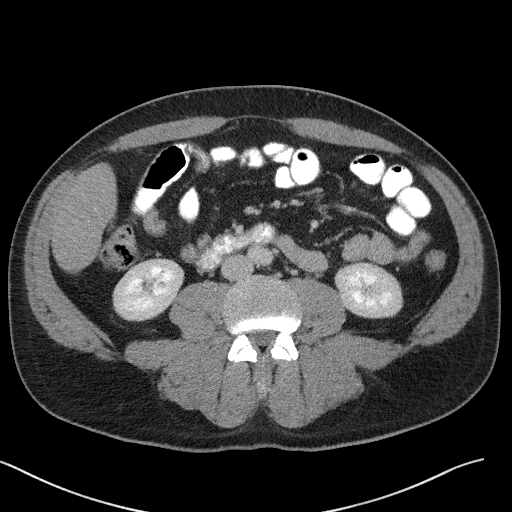
[im 110/165  soft-tissue]
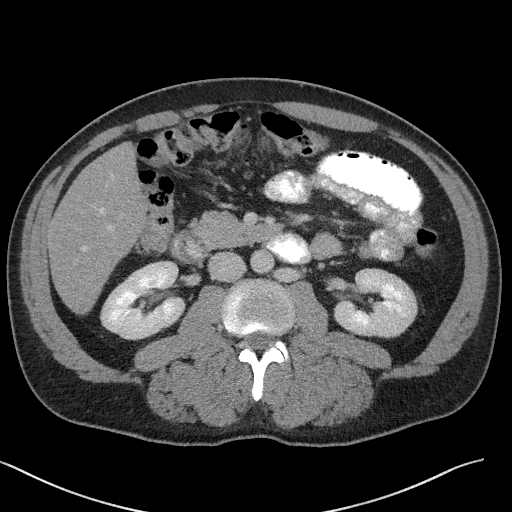
[im 124/165  soft-tissue]
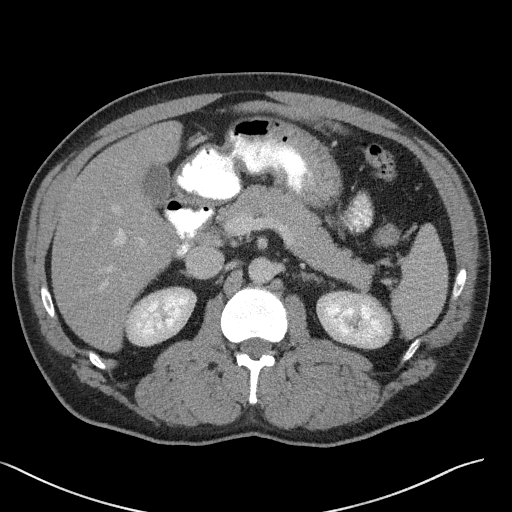
[im 124/165  bone]
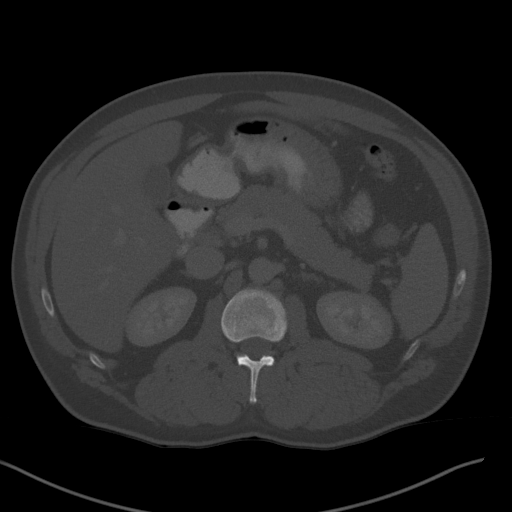
[im 137/165  soft-tissue]
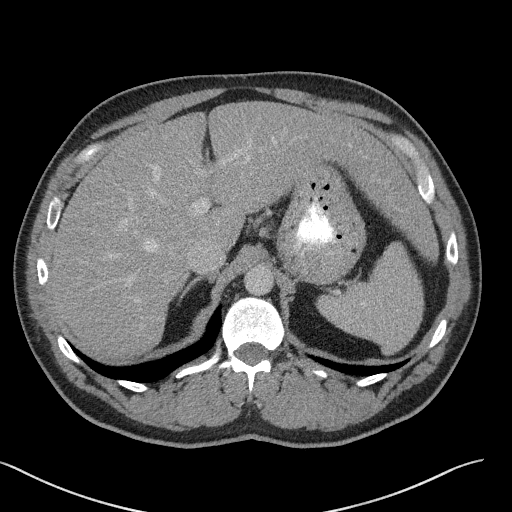
[im 151/165  soft-tissue]
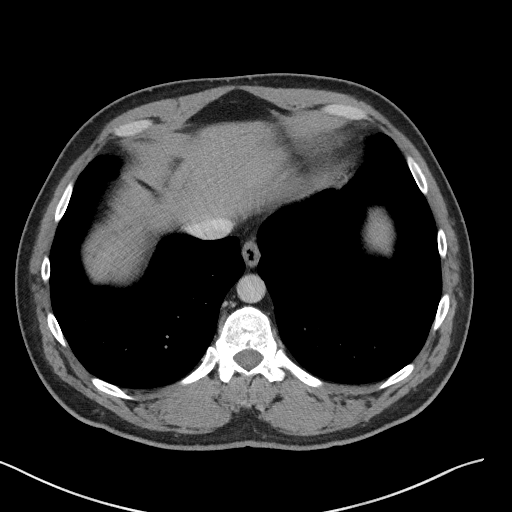

[Series 9: coronal arterial · coronal · arterial · 0.46mm/px · 3 of 98 slices shown]
[im 25/98  soft-tissue]
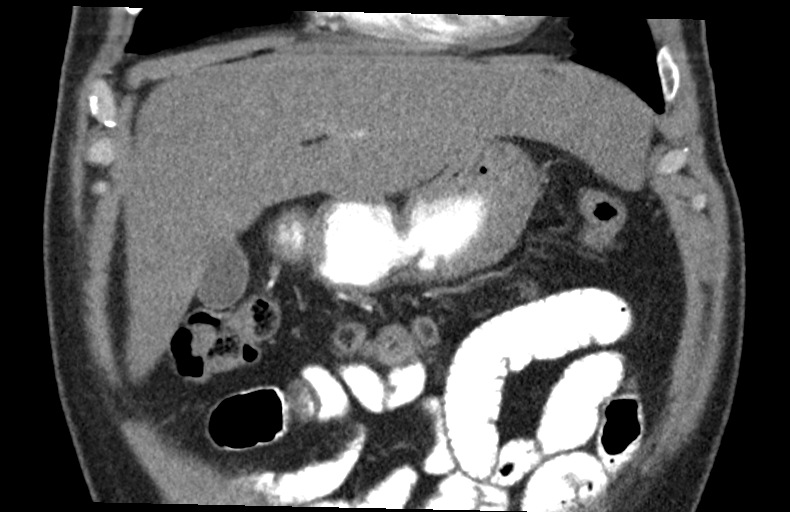
[im 49/98  soft-tissue]
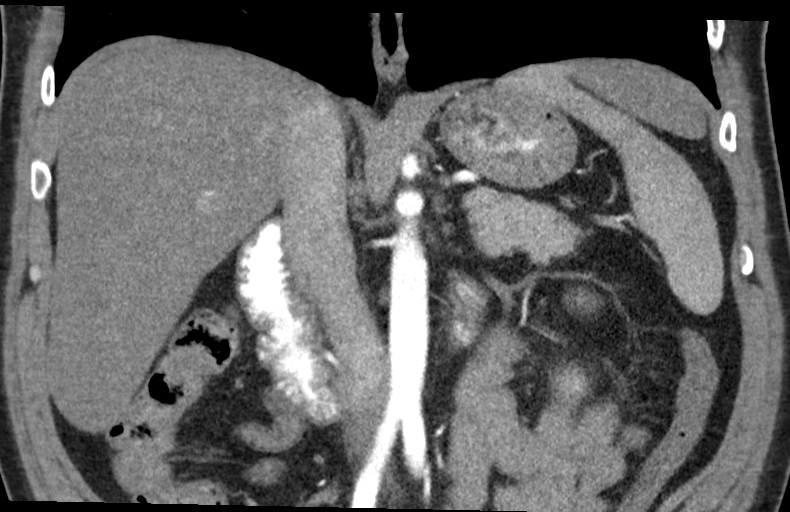
[im 73/98  soft-tissue]
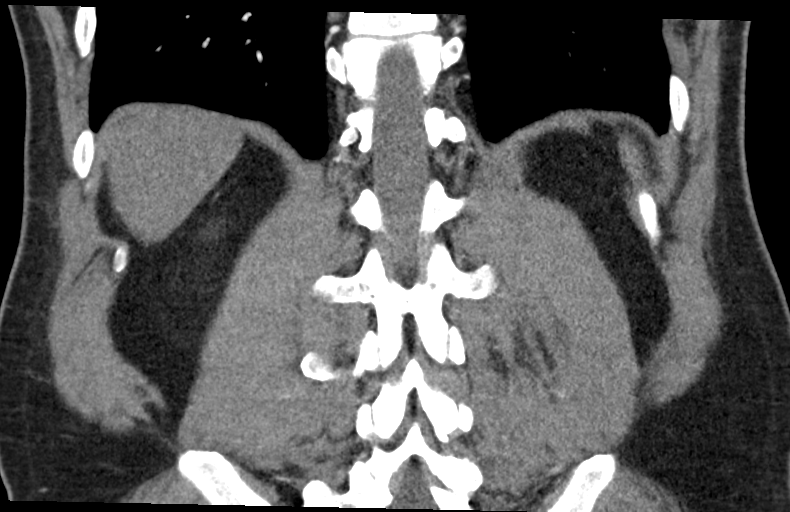

[14 of 46 positions shown; findings below may reference images not displayed]

FINDINGS: Lower chest: The visualized lung bases are clear.

No intra-abdominal free air or free fluid.

Hepatobiliary: There is diffuse fatty infiltration of the liver.
There is enlargement of the left lobe of the liver. No intrahepatic
biliary ductal dilatation. The gallbladder is unremarkable.

Pancreas: Unremarkable. No pancreatic ductal dilatation or
surrounding inflammatory changes.

Spleen: Normal in size without focal abnormality.

Adrenals/Urinary Tract: The adrenal glands are unremarkable. There
is a 1 cm left renal inferior pole cyst. There is no hydronephrosis
on either side. There is symmetric enhancement and excretion of
contrast by both kidneys. The visualized ureters and urinary bladder
appear unremarkable.

Stomach/Bowel: There is no bowel obstruction or active inflammation.
The appendix is normal.

Vascular/Lymphatic: The abdominal aorta and IVC are unremarkable.
There is a retroaortic left renal vein anatomy. No portal venous
gas. There is no adenopathy.

Reproductive: The prostate and seminal vesicles are grossly
unremarkable. No pelvic mass.

Other: None

Musculoskeletal: No acute or significant osseous findings.
IMPRESSION: 1. No acute intra-abdominal or pelvic pathology. No evidence of
recurrent or metastatic disease by CT.
2. Fatty liver with enlargement of the left lobe of the liver.
3. No bowel obstruction or active inflammation. Normal appendix.

## 2020-09-27 ENCOUNTER — Other Ambulatory Visit: Payer: Self-pay | Admitting: Gastroenterology

## 2020-10-13 ENCOUNTER — Other Ambulatory Visit (HOSPITAL_COMMUNITY): Payer: BC Managed Care – PPO

## 2020-10-17 ENCOUNTER — Ambulatory Visit (HOSPITAL_BASED_OUTPATIENT_CLINIC_OR_DEPARTMENT_OTHER): Admission: RE | Admit: 2020-10-17 | Payer: BC Managed Care – PPO | Source: Home / Self Care | Admitting: Surgery

## 2020-10-17 ENCOUNTER — Encounter (HOSPITAL_BASED_OUTPATIENT_CLINIC_OR_DEPARTMENT_OTHER): Admission: RE | Payer: Self-pay | Source: Home / Self Care

## 2020-10-17 SURGERY — EXAM UNDER ANESTHESIA WITH ANAL FISSUROTOMY
Anesthesia: General

## 2020-11-09 NOTE — Telephone Encounter (Signed)
Yes, please arrange office visit.

## 2020-11-09 NOTE — Telephone Encounter (Signed)
Erline Levine please schedule OV per Vicente Males.

## 2020-11-09 NOTE — Telephone Encounter (Signed)
Nathaniel Wilson, last OV was 08/24/20. Does he need OV to schedule EGD?

## 2021-01-03 ENCOUNTER — Ambulatory Visit: Payer: BC Managed Care – PPO | Admitting: Gastroenterology

## 2021-02-14 ENCOUNTER — Ambulatory Visit: Payer: BC Managed Care – PPO | Admitting: Gastroenterology

## 2021-03-31 ENCOUNTER — Ambulatory Visit: Payer: BC Managed Care – PPO | Admitting: Gastroenterology

## 2021-05-25 ENCOUNTER — Ambulatory Visit: Payer: BC Managed Care – PPO | Admitting: Gastroenterology

## 2023-11-03 NOTE — Progress Notes (Deleted)
GI Office Note    Referring Provider: Rema Jasmine, NP Primary Care Physician:  Rema Jasmine, NP  Primary Gastroenterologist: Hennie Duos. Marletta Lor, DO   Chief Complaint   No chief complaint on file.   History of Present Illness   Nathaniel Wilson is a 42 y.o. male presenting today  at the request of *** for   with a history of rectal carcinoid originally diagnosed in 2016, Barrett's in 2016, chronic RUQ abdominal pain. History of alcohol use. Fatty liver. EGD and colonoscopy in Dec 2019. Findings of gastritis and duodenitis. Colonoscopy with at least 3 rectal carcinoid lesions, Grade 1. Completed Flex sig with EUS. No recurrent NET. Concern for small lesion on anal papilla that could be inflammatory vs developing condyloma; scheduled for excision by Dr. Cliffton Asters in Oct 2021. Needs rectal EUS in Jan 2022.     04/2023:cre 0.85, alb 4.5, Tbili 0.5, AP 94, AST 42H, ALT 53H, A1C 6.5, GGT 189H, Hep A IgM neg, Hep B core Ab neg, Hep C Ab neg.       Medications   Current Outpatient Medications  Medication Sig Dispense Refill   Fluticasone-Salmeterol (ADVAIR) 250-50 MCG/DOSE AEPB Inhale 1 puff into the lungs daily.      insulin detemir (LEVEMIR) 100 UNIT/ML injection Inject 12 Units into the skin at bedtime.      lisinopril (PRINIVIL,ZESTRIL) 10 MG tablet Take 10 mg by mouth daily.   2   omeprazole (PRILOSEC) 20 MG capsule TAKE 1 CAPSULE(20 MG) BY MOUTH TWICE DAILY BEFORE A MEAL 60 capsule 3   No current facility-administered medications for this visit.    Allergies   Allergies as of 11/04/2023   (No Known Allergies)     Past Medical History   Past Medical History:  Diagnosis Date   Asthma    Diabetes (HCC)    Fatty liver    GERD (gastroesophageal reflux disease)    Hypertension    Rectal carcinoid tumor    Wears glasses     Past Surgical History   Past Surgical History:  Procedure Laterality Date   BIOPSY  12/09/2018   Procedure: BIOPSY;  Surgeon: West Bali, MD;  [facility];  Service: Endoscopy;;  (Gastric)   COLONOSCOPY     Dr. Samuella Cota: 3 mm polyps splenic flexure (adenomatous), 1.5 cm polyp rectum (well-differentiated neuroendocrine tumor)   COLONOSCOPY WITH PROPOFOL N/A 12/09/2018   Dr. Darrick Penna: one 6 mm polyp in mid transverse colon, three 3-5 mm polyps in rectum, 2 possible carcinoid lesions in rectum. Path witth transverse hyperplastic polyp, rectal polyps well-differentiated NET, Grade 1, no mitotic activity or necrosis indentified.    ESOPHAGOGASTRODUODENOSCOPY  03/2015   Dr. Samuella Cota: esophageal erosions, erosions in duodenal bulb, +Barrett's but no dysplasia   ESOPHAGOGASTRODUODENOSCOPY (EGD) WITH PROPOFOL N/A 12/09/2018   gastritis and duodenitis. No abnormal villi.    EUS N/A 01/11/2020   no malignant-appearing lymph nodes. unremarkable rectal images. no recurrent NET. Needs flex sig with EUS in 1 year. ANAL PAPILLA small lesion that could be inflammatory vs developing condyloma.    FLEXIBLE SIGMOIDOSCOPY N/A 01/11/2020   Procedure: FLEXIBLE SIGMOIDOSCOPY;  Surgeon: Meridee Score Netty Starring., MD;  Location: [facility] ENDOSCOPY;  Service: Endoscopy;  Laterality: N/A;   HEMOSTASIS CLIP PLACEMENT  01/11/2020   Procedure: HEMOSTASIS CLIP PLACEMENT;  Surgeon: Lemar Lofty., MD;  Location: [facility] ENDOSCOPY;  Service: Endoscopy;;   POLYPECTOMY  12/09/2018   Procedure: POLYPECTOMY;  Surgeon: West Bali, MD;  [facility];  Service: Endoscopy;;  (colon)   POLYPECTOMY  01/11/2020   Procedure: POLYPECTOMY;  Surgeon: Mansouraty, Netty Starring., MD;  Location: [facility] ENDOSCOPY;  Service: Endoscopy;;    Past Family History   Family History  Problem Relation Age of Onset   Colon cancer Sister 31       deceased from colon cancer age 30    Cancer Mother        late 44s, deceased    Drug abuse Brother        drug overdose early 75s    Colon polyps Neg Hx     Past Social History   Social History   Socioeconomic History    Marital status: Married    Spouse name: Not on file   Number of children: Not on file   Years of education: Not on file   Highest education level: Not on file  Occupational History   Not on file  Tobacco Use   Smoking status: Every Day    Current packs/day: 1.00    Average packs/day: 1 pack/day for 30.2 years (30.2 ttl pk-yrs)    Types: Cigarettes    Start date: 08/26/1993   Smokeless tobacco: Never  Vaping Use   Vaping status: Never Used  Substance and Sexual Activity   Alcohol use: Yes    Comment: 6 pack of beer on weekends   Drug use: Never   Sexual activity: Not on file  Other Topics Concern   Not on file  Social History Narrative   Not on file   Social Determinants of Health   Financial Resource Strain: Not on file  Food Insecurity: Not on file  Transportation Needs: Not on file  Physical Activity: Not on file  Stress: Not on file  Social Connections: Not on file  Intimate Partner Violence: Not on file    Review of Systems   General: Negative for anorexia, weight loss, fever, chills, fatigue, weakness. ENT: Negative for hoarseness, difficulty swallowing , nasal congestion. CV: Negative for chest pain, angina, palpitations, dyspnea on exertion, peripheral edema.  Respiratory: Negative for dyspnea at rest, dyspnea on exertion, cough, sputum, wheezing.  GI: See history of present illness. GU:  Negative for dysuria, hematuria, urinary incontinence, urinary frequency, nocturnal urination.  Endo: Negative for unusual weight change.     Physical Exam   There were no vitals taken for this visit.   General: Well-nourished, well-developed in no acute distress.  Eyes: No icterus. Mouth: Oropharyngeal mucosa moist and pink , no lesions erythema or exudate. Lungs: Clear to auscultation bilaterally.  Heart: Regular rate and rhythm, no murmurs rubs or gallops.  Abdomen: Bowel sounds are normal, nontender, nondistended, no hepatosplenomegaly or masses,  no abdominal  bruits or hernia , no rebound or guarding.  Rectal: ***  Extremities: No lower extremity edema. No clubbing or deformities. Neuro: Alert and oriented x 4   Skin: Warm and dry, no jaundice.   Psych: Alert and cooperative, normal mood and affect.  Labs   *** Imaging Studies   No results found.  Assessment       PLAN   ***   Leanna Battles. Melvyn Neth, MHS, PA-C [facility]

## 2023-11-04 ENCOUNTER — Ambulatory Visit: Payer: BC Managed Care – PPO | Admitting: Gastroenterology

## 2023-11-20 ENCOUNTER — Encounter: Payer: Self-pay | Admitting: *Deleted

## 2024-01-07 ENCOUNTER — Ambulatory Visit: Payer: BC Managed Care – PPO | Admitting: Gastroenterology

## 2024-01-17 ENCOUNTER — Ambulatory Visit: Payer: BC Managed Care – PPO | Admitting: Gastroenterology

## 2024-02-04 ENCOUNTER — Ambulatory Visit: Payer: BC Managed Care – PPO | Admitting: Gastroenterology

## 2024-02-11 ENCOUNTER — Ambulatory Visit: Payer: BC Managed Care – PPO | Admitting: Gastroenterology

## 2024-03-07 NOTE — Progress Notes (Deleted)
 GI Office Note    Referring Provider: Rema Jasmine, NP Primary Care Physician:  Rema Jasmine, NP  Primary Gastroenterologist: Hennie Duos. Marletta Lor, DO   Chief Complaint   No chief complaint on file.    History of Present Illness   Nathaniel Wilson is a 43 y.o. male presenting today at the request of Rema Jasmine, NP for diverticulitus, elevated LFTs, fatty liver, received referral in 10/2023. Last seen in 2021.   Anx/dep/etoh, htn, dm, gerd, vit d def, asthma  04/2023: Hgb 17.2, Platelets 154, WBC 8, cre 0.85, tbili 0.5, AP 94, AST 42, ALT 54, A1c d6.5, GGT 189H, Hep A IgM neg, Hep B surf***neg, Hep B core Ab***neg, HCV abd neg  10/2023***  history of rectal carcinoid originally diagnosed in 2016, Barrett's in 2016, chronic RUQ abdominal pain. History of alcohol use. Fatty liver. EGD and colonoscopy in Dec 2019. Findings of gastritis and duodenitis. Colonoscopy with at least 3 rectal carcinoid lesions, Grade 1. Completed Flex sig with EUS. No recurrent NET. Concern for small lesion on anal papilla that could be inflammatory vs developing condyloma; scheduled for excision by Dr. Cliffton Asters in Oct 2021. Needs rectal EUS in Jan 2022.    Gallbladder remains in situ. No gallstones in 2019 on US abdomen. Felt to have musculoskeletal component to RUQ pain historically. No RUQ pain postprandially. RUQ discomfort only if walking at work. Fleeting and intermittent.   History of elevated LFTs. Nov 2018: negative Hep A antibody, negative Hep B surface antigen, negative Hep B core antibody, negative Hep C antibody. Last labs from Aug 2020: Hgb 16.4, Hct 47.6, platelets 161, creatinine 0.80, albumin 4.6, Tbili 0.6, ALk Phos 68, AST 37, ALT 42. LFTs have improved from Sept 2019. Most recently outside labs dated March 17, 2020: Alk Phos 94, AST 32, ALT 40, Tbili 0.3, albumin 4.3.   Medications   Current Outpatient Medications  Medication Sig Dispense Refill   Fluticasone-Salmeterol (ADVAIR) 250-50  MCG/DOSE AEPB Inhale 1 puff into the lungs daily.      insulin detemir (LEVEMIR) 100 UNIT/ML injection Inject 12 Units into the skin at bedtime.      lisinopril (PRINIVIL,ZESTRIL) 10 MG tablet Take 10 mg by mouth daily.   2   omeprazole (PRILOSEC) 20 MG capsule TAKE 1 CAPSULE(20 MG) BY MOUTH TWICE DAILY BEFORE A MEAL 60 capsule 3   No current facility-administered medications for this visit.    Allergies   Allergies as of 03/09/2024   (No Known Allergies)    Past Medical History   Past Medical History:  Diagnosis Date   Asthma    Diabetes (HCC)    Fatty liver    GERD (gastroesophageal reflux disease)    Hypertension    Rectal carcinoid tumor    Wears glasses     Past Surgical History   Past Surgical History:  Procedure Laterality Date   BIOPSY  12/09/2018   Procedure: BIOPSY;  Surgeon: West Bali, MD;  [facility];  Service: Endoscopy;;  (Gastric)   COLONOSCOPY     Dr. Samuella Cota: 3 mm polyps splenic flexure (adenomatous), 1.5 cm polyp rectum (well-differentiated neuroendocrine tumor)   COLONOSCOPY WITH PROPOFOL N/A 12/09/2018   Dr. Darrick Penna: one 6 mm polyp in mid transverse colon, three 3-5 mm polyps in rectum, 2 possible carcinoid lesions in rectum. Path witth transverse hyperplastic polyp, rectal polyps well-differentiated NET, Grade 1, no mitotic activity or necrosis indentified.    ESOPHAGOGASTRODUODENOSCOPY  03/2015   Dr. Samuella Cota:  esophageal erosions, erosions in duodenal bulb, +Barrett's but no dysplasia   ESOPHAGOGASTRODUODENOSCOPY (EGD) WITH PROPOFOL N/A 12/09/2018   gastritis and duodenitis. No abnormal villi.    EUS N/A 01/11/2020   no malignant-appearing lymph nodes. unremarkable rectal images. no recurrent NET. Needs flex sig with EUS in 1 year. ANAL PAPILLA small lesion that could be inflammatory vs developing condyloma.    FLEXIBLE SIGMOIDOSCOPY N/A 01/11/2020   Procedure: FLEXIBLE SIGMOIDOSCOPY;  Surgeon: Meridee Score Netty Starring., MD;  Location: [facility]  ENDOSCOPY;  Service: Endoscopy;  Laterality: N/A;   HEMOSTASIS CLIP PLACEMENT  01/11/2020   Procedure: HEMOSTASIS CLIP PLACEMENT;  Surgeon: Lemar Lofty., MD;  Location: [facility] ENDOSCOPY;  Service: Endoscopy;;   POLYPECTOMY  12/09/2018   Procedure: POLYPECTOMY;  Surgeon: West Bali, MD;  [facility];  Service: Endoscopy;;  (colon)   POLYPECTOMY  01/11/2020   Procedure: POLYPECTOMY;  Surgeon: Lemar Lofty., MD;  Location: [facility] ENDOSCOPY;  Service: Endoscopy;;    Past Family History   Family History  Problem Relation Age of Onset   Colon cancer Sister 46       deceased from colon cancer age 8    Cancer Mother        late 74s, deceased    Drug abuse Brother        drug overdose early 59s    Colon polyps Neg Hx     Past Social History   Social History   Socioeconomic History   Marital status: Married    Spouse name: Not on file   Number of children: Not on file   Years of education: Not on file   Highest education level: Not on file  Occupational History   Not on file  Tobacco Use   Smoking status: Every Day    Current packs/day: 1.00    Average packs/day: 1 pack/day for 30.5 years (30.5 ttl pk-yrs)    Types: Cigarettes    Start date: 08/26/1993   Smokeless tobacco: Never  Vaping Use   Vaping status: Never Used  Substance and Sexual Activity   Alcohol use: Yes    Comment: 6 pack of beer on weekends   Drug use: Never   Sexual activity: Not on file  Other Topics Concern   Not on file  Social History Narrative   Not on file   Social Drivers of Health   Financial Resource Strain: Not on file  Food Insecurity: Not on file  Transportation Needs: Not on file  Physical Activity: Not on file  Stress: Not on file  Social Connections: Not on file  Intimate Partner Violence: Not on file    Review of Systems   General: Negative for anorexia, weight loss, fever, chills, fatigue, weakness. Eyes: Negative for vision changes.  ENT: Negative  for hoarseness, difficulty swallowing , nasal congestion. CV: Negative for chest pain, angina, palpitations, dyspnea on exertion, peripheral edema.  Respiratory: Negative for dyspnea at rest, dyspnea on exertion, cough, sputum, wheezing.  GI: See history of present illness. GU:  Negative for dysuria, hematuria, urinary incontinence, urinary frequency, nocturnal urination.  MS: Negative for joint pain, low back pain.  Derm: Negative for rash or itching.  Neuro: Negative for weakness, abnormal sensation, seizure, frequent headaches, memory loss,  confusion.  Psych: Negative for anxiety, depression, suicidal ideation, hallucinations.  Endo: Negative for unusual weight change.  Heme: Negative for bruising or bleeding. Allergy: Negative for rash or hives.  Physical Exam   There were no vitals taken for this  visit.   General: Well-nourished, well-developed in no acute distress.  Head: Normocephalic, atraumatic.   Eyes: Conjunctiva pink, no icterus. Mouth: Oropharyngeal mucosa moist and pink , no lesions erythema or exudate. Neck: Supple without thyromegaly, masses, or lymphadenopathy.  Lungs: Clear to auscultation bilaterally.  Heart: Regular rate and rhythm, no murmurs rubs or gallops.  Abdomen: Bowel sounds are normal, nontender, nondistended, no hepatosplenomegaly or masses,  no abdominal bruits or hernia, no rebound or guarding.   Rectal: *** Extremities: No lower extremity edema. No clubbing or deformities.  Neuro: Alert and oriented x 4 , grossly normal neurologically.  Skin: Warm and dry, no rash or jaundice.   Psych: Alert and cooperative, normal mood and affect.  Labs   *** Imaging Studies   No results found.  Assessment       PLAN   ***   Leanna Battles. Melvyn Neth, MHS, PA-C [facility]

## 2024-03-09 ENCOUNTER — Ambulatory Visit: Payer: BC Managed Care – PPO | Admitting: Gastroenterology

## 2024-03-31 NOTE — Progress Notes (Deleted)
 GI Office Note    Referring Provider: Rema Jasmine, NP Primary Care Physician:  Rema Jasmine, NP  Primary Gastroenterologist:  Chief Complaint   No chief complaint on file.   History of Present Illness   Nathaniel Wilson is a 43 y.o. male presenting today   at the request of Rema Jasmine, NP for diverticulitus, elevated LFTs, fatty liver, received referral in 10/2023. Last seen in 2021.    Anx/dep/etoh, htn, dm, gerd, vit d def, asthma   04/2023: Hgb 17.2, Platelets 154, WBC 8, cre 0.85, tbili 0.5, AP 94, AST 42, ALT 54, A1c d6.5, GGT 189H, Hep A IgM neg, Hep B surf***neg, Hep B core Ab***neg, HCV abd neg   10/2023***   history of rectal carcinoid originally diagnosed in 2016, Barrett's in 2016, chronic RUQ abdominal pain. History of alcohol use. Fatty liver. EGD and colonoscopy in Dec 2019. Findings of gastritis and duodenitis. Colonoscopy with at least 3 rectal carcinoid lesions, Grade 1. Completed Flex sig with EUS. No recurrent NET. Concern for small lesion on anal papilla that could be inflammatory vs developing condyloma; scheduled for excision by Dr. Cliffton Asters in Oct 2021. Needs rectal EUS in Jan 2022.    Gallbladder remains in situ. No gallstones in 2019 on US abdomen. Felt to have musculoskeletal component to RUQ pain historically. No RUQ pain postprandially. RUQ discomfort only if walking at work. Fleeting and intermittent.    History of elevated LFTs. Nov 2018: negative Hep A antibody, negative Hep B surface antigen, negative Hep B core antibody, negative Hep C antibody. Last labs from Aug 2020: Hgb 16.4, Hct 47.6, platelets 161, creatinine 0.80, albumin 4.6, Tbili 0.6, ALk Phos 68, AST 37, ALT 42. LFTs have improved from Sept 2019. Most recently outside labs dated March 17, 2020: Alk Phos 94, AST 32, ALT 40, Tbili 0.3, albumin 4.3.         Medications   Current Outpatient Medications  Medication Sig Dispense Refill   Fluticasone-Salmeterol (ADVAIR) 250-50 MCG/DOSE  AEPB Inhale 1 puff into the lungs daily.      insulin detemir (LEVEMIR) 100 UNIT/ML injection Inject 12 Units into the skin at bedtime.      lisinopril (PRINIVIL,ZESTRIL) 10 MG tablet Take 10 mg by mouth daily.   2   omeprazole (PRILOSEC) 20 MG capsule TAKE 1 CAPSULE(20 MG) BY MOUTH TWICE DAILY BEFORE A MEAL 60 capsule 3   No current facility-administered medications for this visit.    Allergies   Allergies as of 04/01/2024   (No Known Allergies)     Past Medical History   Past Medical History:  Diagnosis Date   Asthma    Diabetes (HCC)    Fatty liver    GERD (gastroesophageal reflux disease)    Hypertension    Rectal carcinoid tumor    Wears glasses     Past Surgical History   Past Surgical History:  Procedure Laterality Date   BIOPSY  12/09/2018   Procedure: BIOPSY;  Surgeon: West Bali, MD;  [facility];  Service: Endoscopy;;  (Gastric)   COLONOSCOPY     Dr. Samuella Cota: 3 mm polyps splenic flexure (adenomatous), 1.5 cm polyp rectum (well-differentiated neuroendocrine tumor)   COLONOSCOPY WITH PROPOFOL N/A 12/09/2018   Dr. Darrick Penna: one 6 mm polyp in mid transverse colon, three 3-5 mm polyps in rectum, 2 possible carcinoid lesions in rectum. Path witth transverse hyperplastic polyp, rectal polyps well-differentiated NET, Grade 1, no mitotic activity or necrosis indentified.  ESOPHAGOGASTRODUODENOSCOPY  03/2015   Dr. Samuella Cota: esophageal erosions, erosions in duodenal bulb, +Barrett's but no dysplasia   ESOPHAGOGASTRODUODENOSCOPY (EGD) WITH PROPOFOL N/A 12/09/2018   gastritis and duodenitis. No abnormal villi.    EUS N/A 01/11/2020   no malignant-appearing lymph nodes. unremarkable rectal images. no recurrent NET. Needs flex sig with EUS in 1 year. ANAL PAPILLA small lesion that could be inflammatory vs developing condyloma.    FLEXIBLE SIGMOIDOSCOPY N/A 01/11/2020   Procedure: FLEXIBLE SIGMOIDOSCOPY;  Surgeon: Meridee Score Netty Starring., MD;  Location: [facility]  ENDOSCOPY;  Service: Endoscopy;  Laterality: N/A;   HEMOSTASIS CLIP PLACEMENT  01/11/2020   Procedure: HEMOSTASIS CLIP PLACEMENT;  Surgeon: Lemar Lofty., MD;  Location: [facility] ENDOSCOPY;  Service: Endoscopy;;   POLYPECTOMY  12/09/2018   Procedure: POLYPECTOMY;  Surgeon: West Bali, MD;  [facility];  Service: Endoscopy;;  (colon)   POLYPECTOMY  01/11/2020   Procedure: POLYPECTOMY;  Surgeon: Lemar Lofty., MD;  Location: [facility] ENDOSCOPY;  Service: Endoscopy;;    Past Family History   Family History  Problem Relation Age of Onset   Colon cancer Sister 33       deceased from colon cancer age 35    Cancer Mother        late 49s, deceased    Drug abuse Brother        drug overdose early 68s    Colon polyps Neg Hx     Past Social History   Social History   Socioeconomic History   Marital status: Married    Spouse name: Not on file   Number of children: Not on file   Years of education: Not on file   Highest education level: Not on file  Occupational History   Not on file  Tobacco Use   Smoking status: Every Day    Current packs/day: 1.00    Average packs/day: 1 pack/day for 30.6 years (30.6 ttl pk-yrs)    Types: Cigarettes    Start date: 08/26/1993   Smokeless tobacco: Never  Vaping Use   Vaping status: Never Used  Substance and Sexual Activity   Alcohol use: Yes    Comment: 6 pack of beer on weekends   Drug use: Never   Sexual activity: Not on file  Other Topics Concern   Not on file  Social History Narrative   Not on file   Social Drivers of Health   Financial Resource Strain: Not on file  Food Insecurity: Not on file  Transportation Needs: Not on file  Physical Activity: Not on file  Stress: Not on file  Social Connections: Not on file  Intimate Partner Violence: Not on file    Review of Systems   General: Negative for anorexia, weight loss, fever, chills, fatigue, weakness. ENT: Negative for hoarseness, difficulty swallowing  , nasal congestion. CV: Negative for chest pain, angina, palpitations, dyspnea on exertion, peripheral edema.  Respiratory: Negative for dyspnea at rest, dyspnea on exertion, cough, sputum, wheezing.  GI: See history of present illness. GU:  Negative for dysuria, hematuria, urinary incontinence, urinary frequency, nocturnal urination.  Endo: Negative for unusual weight change.     Physical Exam   There were no vitals taken for this visit.   General: Well-nourished, well-developed in no acute distress.  Eyes: No icterus. Mouth: Oropharyngeal mucosa moist and pink , no lesions erythema or exudate. Lungs: Clear to auscultation bilaterally.  Heart: Regular rate and rhythm, no murmurs rubs or gallops.  Abdomen: Bowel sounds are normal,  nontender, nondistended, no hepatosplenomegaly or masses,  no abdominal bruits or hernia , no rebound or guarding.  Rectal: ***  Extremities: No lower extremity edema. No clubbing or deformities. Neuro: Alert and oriented x 4   Skin: Warm and dry, no jaundice.   Psych: Alert and cooperative, normal mood and affect.  Labs   *** Imaging Studies   No results found.  Assessment       PLAN   ***   Leanna Battles. Melvyn Neth, MHS, PA-C [facility]

## 2024-04-01 ENCOUNTER — Ambulatory Visit: Admitting: Gastroenterology

## 2024-05-06 ENCOUNTER — Ambulatory Visit: Admitting: Gastroenterology

## 2024-05-18 ENCOUNTER — Telehealth (INDEPENDENT_AMBULATORY_CARE_PROVIDER_SITE_OTHER): Payer: Self-pay | Admitting: *Deleted

## 2024-05-18 ENCOUNTER — Ambulatory Visit: Admitting: Gastroenterology

## 2024-05-18 NOTE — Telephone Encounter (Signed)
 THIS PATIENT HAS CANCELED MULTIPLE APTS - DO NOT SCHEDULE AGAIN WITHOUT A NEW REFERRAL
